# Patient Record
Sex: Male | Born: 1940 | Race: White | Hispanic: No | Marital: Single | Smoking: Current every day smoker
Health system: Southern US, Community
[De-identification: ages and names within clinical notes are randomized; demographics above are authoritative.]

## PROBLEM LIST (undated history)

## (undated) DIAGNOSIS — I10 Essential (primary) hypertension: Secondary | ICD-10-CM

## (undated) DIAGNOSIS — C801 Malignant (primary) neoplasm, unspecified: Secondary | ICD-10-CM

## (undated) DIAGNOSIS — I2510 Atherosclerotic heart disease of native coronary artery without angina pectoris: Secondary | ICD-10-CM

## (undated) DIAGNOSIS — I219 Acute myocardial infarction, unspecified: Secondary | ICD-10-CM

## (undated) DIAGNOSIS — Z87442 Personal history of urinary calculi: Secondary | ICD-10-CM

## (undated) HISTORY — PX: TONSILLECTOMY: SUR1361

## (undated) HISTORY — PX: CAROTID ENDARTERECTOMY: SUR193

## (undated) HISTORY — PX: APPENDECTOMY: SHX54

## (undated) HISTORY — PX: CORONARY ARTERY BYPASS GRAFT: SHX141

---

## 2006-11-15 ENCOUNTER — Other Ambulatory Visit: Payer: Self-pay

## 2006-11-15 ENCOUNTER — Inpatient Hospital Stay: Payer: Self-pay | Admitting: Internal Medicine

## 2006-11-16 ENCOUNTER — Other Ambulatory Visit: Payer: Self-pay

## 2009-04-07 ENCOUNTER — Ambulatory Visit: Payer: Self-pay | Admitting: Vascular Surgery

## 2010-03-21 ENCOUNTER — Observation Stay: Payer: Self-pay | Admitting: Internal Medicine

## 2010-03-30 ENCOUNTER — Ambulatory Visit: Payer: Self-pay | Admitting: Vascular Surgery

## 2010-05-11 ENCOUNTER — Ambulatory Visit: Payer: Self-pay | Admitting: Vascular Surgery

## 2010-05-25 ENCOUNTER — Inpatient Hospital Stay: Payer: Self-pay | Admitting: Vascular Surgery

## 2011-01-16 ENCOUNTER — Ambulatory Visit: Payer: Self-pay | Admitting: Internal Medicine

## 2011-03-04 ENCOUNTER — Emergency Department: Payer: Self-pay | Admitting: Unknown Physician Specialty

## 2011-08-08 ENCOUNTER — Ambulatory Visit: Payer: Self-pay | Admitting: Specialist

## 2011-11-30 ENCOUNTER — Ambulatory Visit: Payer: Self-pay

## 2011-12-04 ENCOUNTER — Ambulatory Visit: Payer: Self-pay | Admitting: Internal Medicine

## 2012-02-07 ENCOUNTER — Ambulatory Visit: Payer: Self-pay | Admitting: Internal Medicine

## 2012-02-21 ENCOUNTER — Ambulatory Visit: Payer: Self-pay | Admitting: Specialist

## 2012-02-21 LAB — CREATININE, SERUM: EGFR (African American): 50 — ABNORMAL LOW

## 2012-08-26 ENCOUNTER — Ambulatory Visit: Payer: Self-pay | Admitting: Specialist

## 2012-12-17 ENCOUNTER — Ambulatory Visit: Payer: Self-pay | Admitting: Family Medicine

## 2013-03-12 ENCOUNTER — Ambulatory Visit: Payer: Self-pay | Admitting: Specialist

## 2013-09-22 ENCOUNTER — Ambulatory Visit: Payer: Self-pay | Admitting: Specialist

## 2013-11-10 ENCOUNTER — Ambulatory Visit: Payer: Self-pay

## 2013-12-22 ENCOUNTER — Ambulatory Visit: Payer: Self-pay | Admitting: Physician Assistant

## 2014-04-12 IMAGING — CT CT CHEST W/O CM
1 series · 16 of 32 positions shown, 20 images · non-contrast
Comparison: none

REASON FOR EXAM: 6 Mo Follow Up Pulmonary Nodules
COMMENTS:

PROCEDURE:     LARS OLOV - LARS OLOV CHEST WITHOUT CONTRAST  - September 22, 2013  [DATE]
RESULT:     History: Pulmonary nodule.
Comparison Study: CT 03/12/2013.

[Series 2: soft tissue · axial · 0.67mm/px · z∈[-384,-81]mm · 16 of 111 slices shown, 20 images]
[im 5/111  mediastinal]
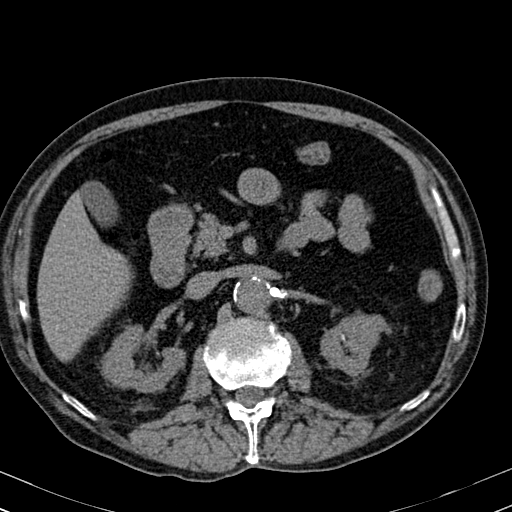
[im 5/111  lung]
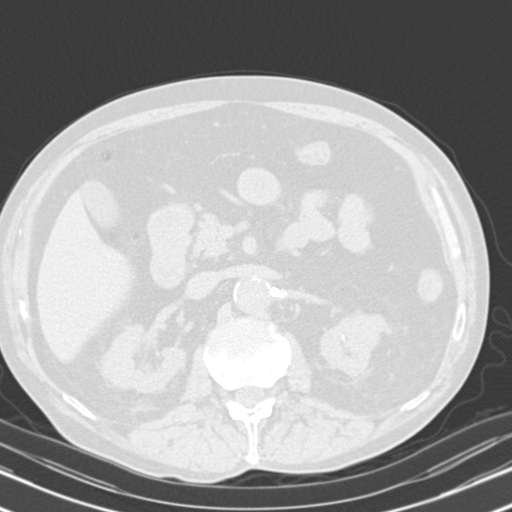
[im 13/111  lung]
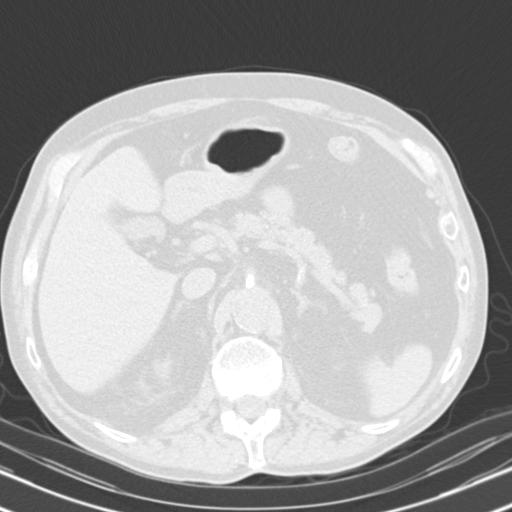
[im 21/111  lung]
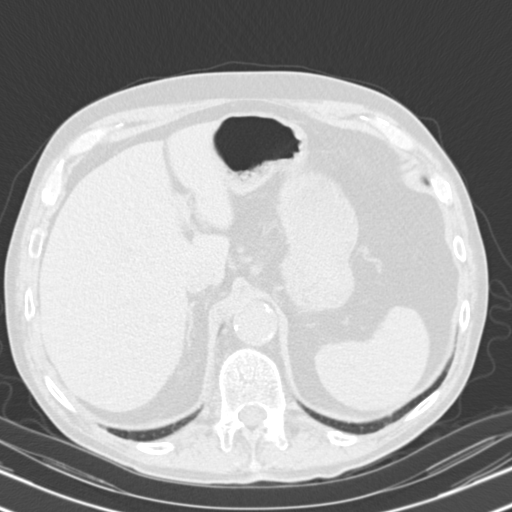
[im 25/111  lung]
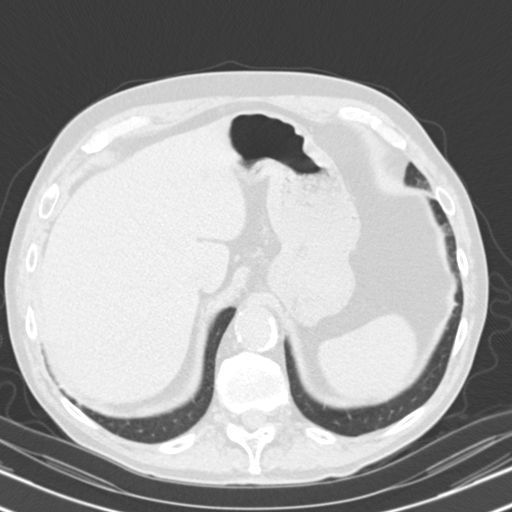
[im 33/111  mediastinal]
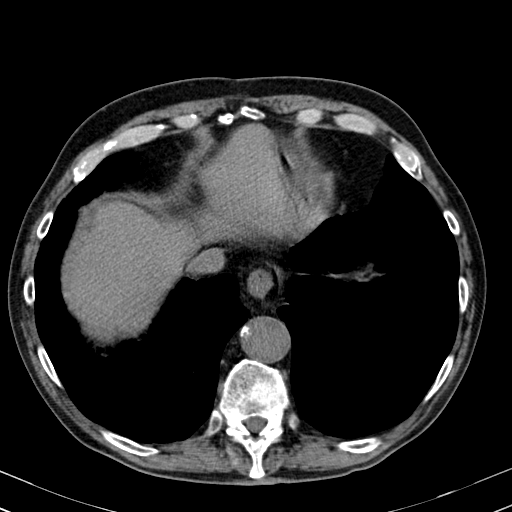
[im 33/111  lung]
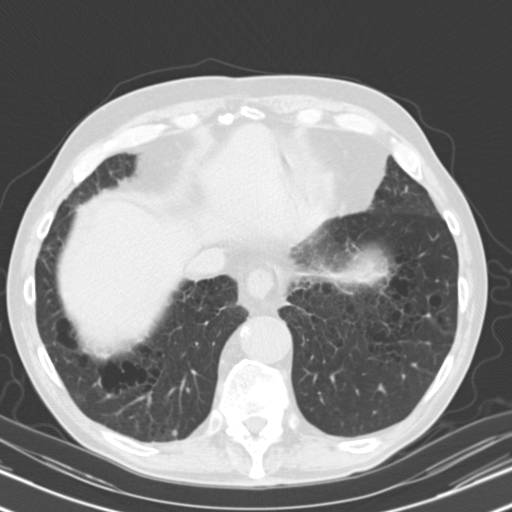
[im 41/111  lung]
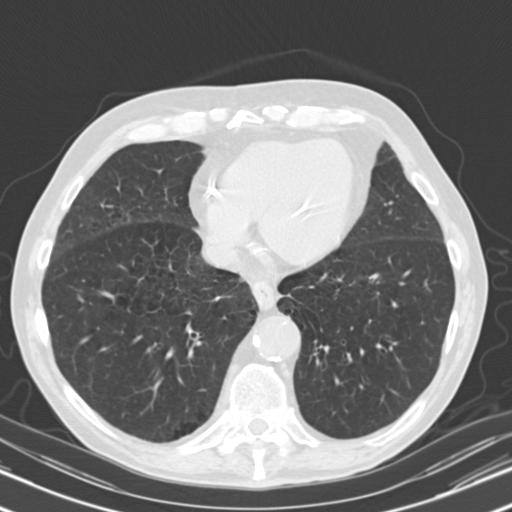
[im 49/111  lung]
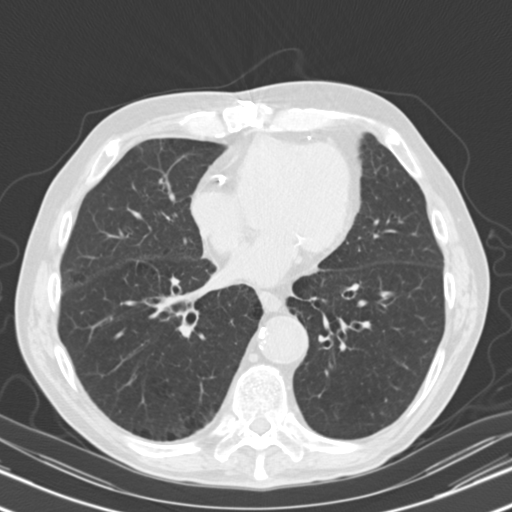
[im 58/111  lung]
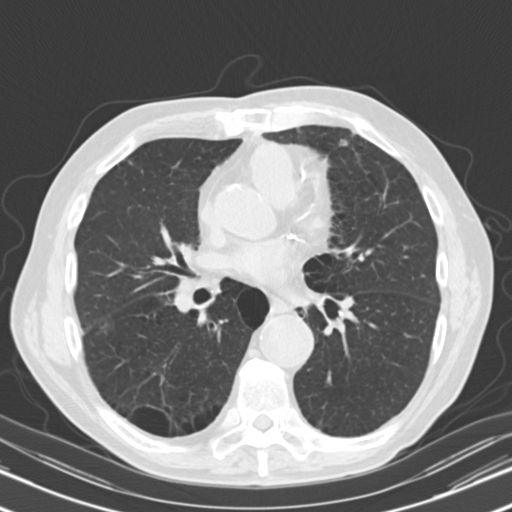
[im 59/111  mediastinal]
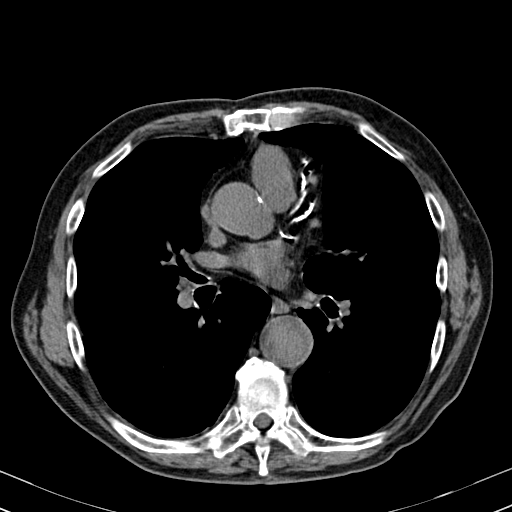
[im 59/111  lung]
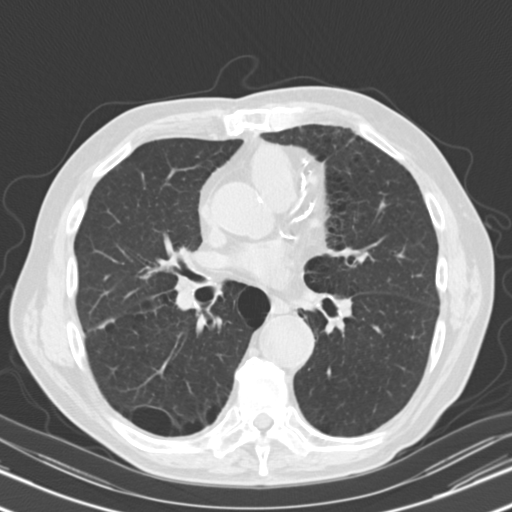
[im 66/111  lung]
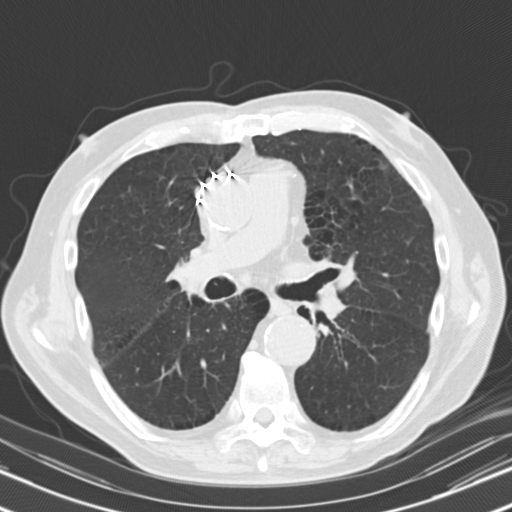
[im 70/111  lung]
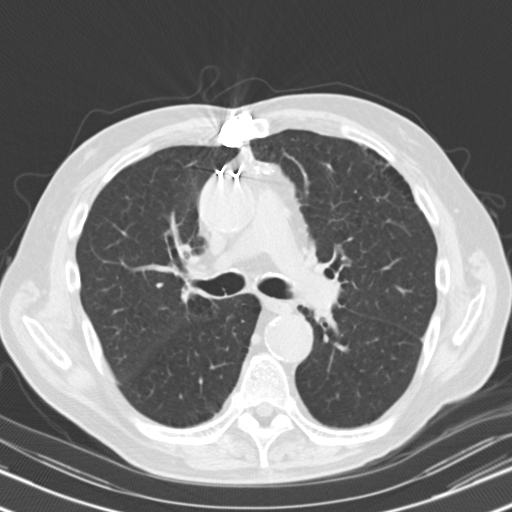
[im 78/111  lung]
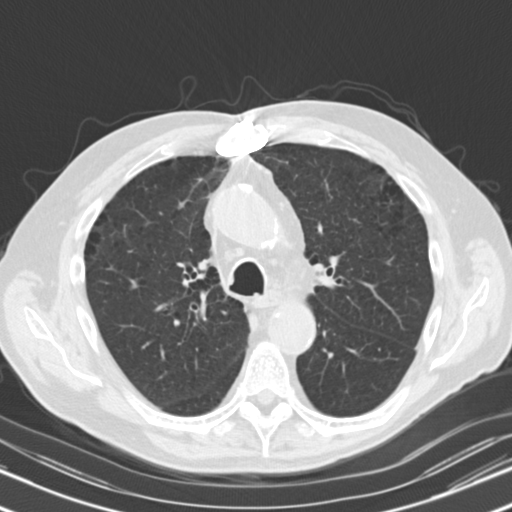
[im 86/111  mediastinal]
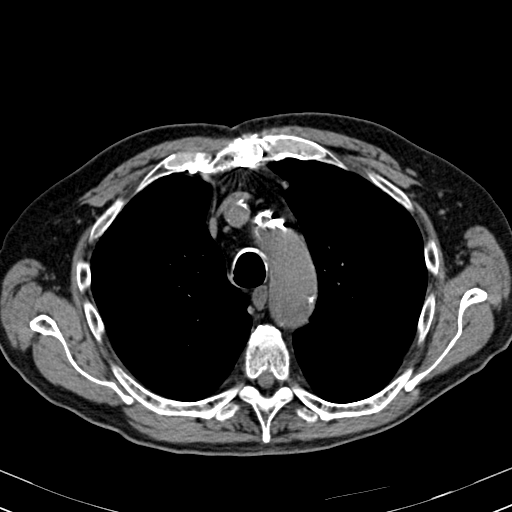
[im 86/111  lung]
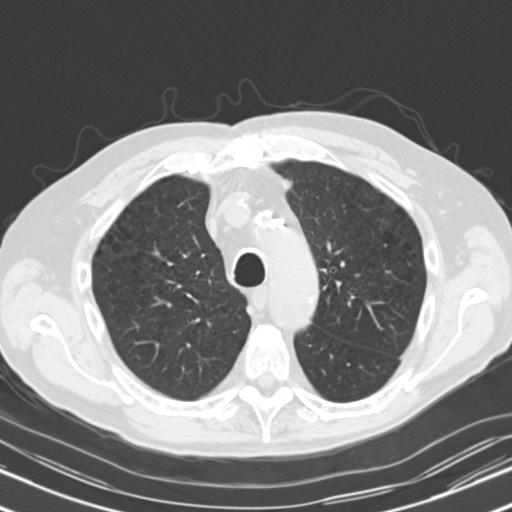
[im 90/111  lung]
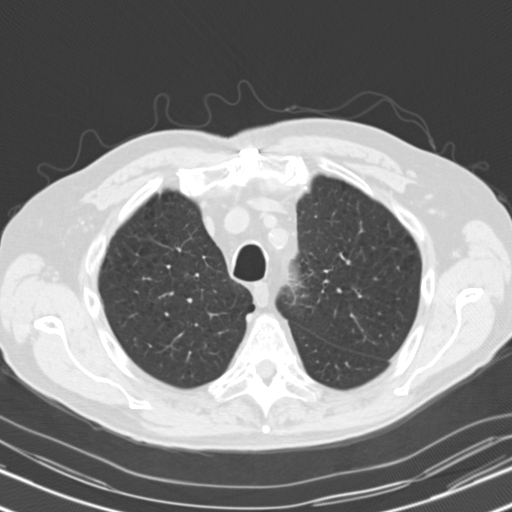
[im 98/111  lung]
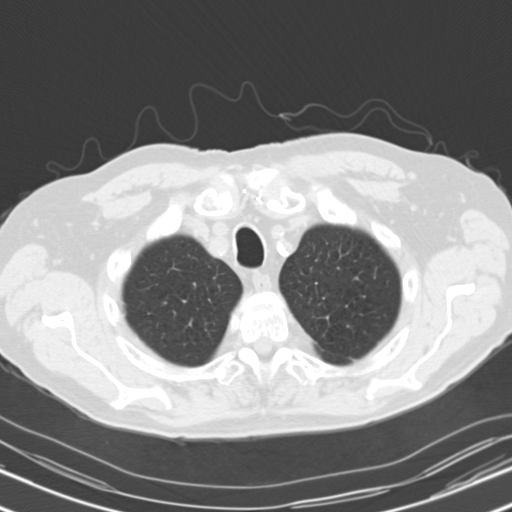
[im 106/111  lung]
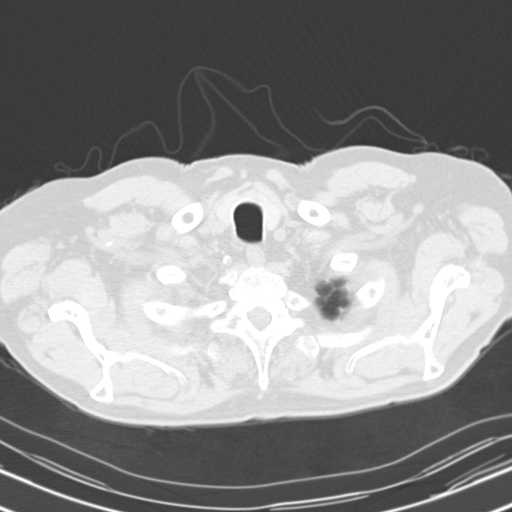

[16 of 32 positions shown; findings below may reference images not displayed]

FINDINGS: Standard nonenhanced CT obtained. Shotty mediastinal lymph nodes
are present. Coronary artery disease. Heart size is normal. Adrenals normal.
Gallstones are noted.

Large airways demonstrate multifocal areas of small a proximally 3 mm soft
tissue disease, most likely mucous. Again noted are multiple subcentimeter
pulmonary nodules with similar appearance from prior study. The largest is
present on lung windows image 85 and measures approximately 7 to 8 mm as on
prior exam. Prior CABG. Heart size normal. Sliding hiatal hernia. Adrenals
normal. No significant bony abnormalities identified.
IMPRESSION: Multiple subcentimeter pulmonary nodules again noted
without significant interim change.

## 2014-05-01 DIAGNOSIS — Z951 Presence of aortocoronary bypass graft: Secondary | ICD-10-CM | POA: Insufficient documentation

## 2014-05-01 DIAGNOSIS — Z95828 Presence of other vascular implants and grafts: Secondary | ICD-10-CM | POA: Insufficient documentation

## 2014-05-01 DIAGNOSIS — I255 Ischemic cardiomyopathy: Secondary | ICD-10-CM | POA: Insufficient documentation

## 2014-05-01 DIAGNOSIS — Z9889 Other specified postprocedural states: Secondary | ICD-10-CM | POA: Insufficient documentation

## 2014-05-01 DIAGNOSIS — E785 Hyperlipidemia, unspecified: Secondary | ICD-10-CM | POA: Insufficient documentation

## 2014-05-01 DIAGNOSIS — I739 Peripheral vascular disease, unspecified: Secondary | ICD-10-CM | POA: Insufficient documentation

## 2015-10-04 ENCOUNTER — Encounter: Payer: Self-pay | Admitting: Emergency Medicine

## 2015-10-04 ENCOUNTER — Ambulatory Visit (INDEPENDENT_AMBULATORY_CARE_PROVIDER_SITE_OTHER): Payer: Medicare Other

## 2015-10-04 ENCOUNTER — Ambulatory Visit
Admission: EM | Admit: 2015-10-04 | Discharge: 2015-10-04 | Disposition: A | Discharge status: Home / Self Care | Payer: Medicare Other | Attending: Family Medicine | Admitting: Family Medicine

## 2015-10-04 DIAGNOSIS — J0110 Acute frontal sinusitis, unspecified: Secondary | ICD-10-CM | POA: Diagnosis not present

## 2015-10-04 DIAGNOSIS — J40 Bronchitis, not specified as acute or chronic: Secondary | ICD-10-CM

## 2015-10-04 MED ORDER — AZITHROMYCIN 250 MG PO TABS
ORAL_TABLET | ORAL | Status: DC
Start: 2015-10-04 — End: 2017-06-22

## 2015-10-04 MED ORDER — BENZONATATE 100 MG PO CAPS: 100.0000 mg | ORAL_CAPSULE | Freq: Three times a day (TID) | ORAL | Status: DC | PRN

## 2015-10-04 NOTE — ED Notes (Signed)
Pt with sinus pressure and drainage

## 2015-10-04 NOTE — Discharge Instructions (Signed)
Take medication as prescribed. Rest. Eat and drink regularly.  Follow-up with your primary care physician this week as needed. Return to the urgent care as needed for new or worsening concerns.

## 2015-10-04 NOTE — ED Provider Notes (Signed)
[facility]  ____________________________________________  Time seen: Approximately 12:35 PM  I have reviewed the triage vital signs and the nursing notes.   HISTORY  Chief Complaint Facial Pain   HPI Scott Larsen is a 75 y.o. male presents for the complaints of to 2 weeks of runny nose, congestion, sinus pressure and cough. Patient reports initially he had more cough and states that the cough is now improved and only intermittent and occasional. States no cough today. Patient states however he continues with sinus pressure and drainage. States sinus drainage is thick and greenish colored. States his face is very tender around his forehead as well as his cheeks. States current sinus discomfort is 5 out of 10 aching. Denies headache. Reports continues to eat and drink well. Denies fevers.  Denies chest pain, shortness of breath, abdominal pain, dizziness, weakness or vision changes. Denies wheezing.  Reports has remained active. Reports history of sinus infection in past and this feels similar to that. Denies known sick contacts.   Past medical history: Hypertension Hyperlipidemia Emphysema Coronary artery disease Tobacco abuse  There are no active problems to display for this patient.   past surgical history 2007 CABG Carotid endarterectomy  PCP: [facility]och[facility]  Current Outpatient Rx  Name  Route  Sig  Dispense  Refill  . simvastatin (ZOCOR) 20 MG tablet   Oral   Take 20 mg by mouth daily.         .           .             Allergies Review of patient's allergies indicates no known allergies.  History reviewed. No pertinent family history.  Social History Social History  Substance Use Topics  . Smoking status: Current Every Day Smoker  . Smokeless tobacco: None  . Alcohol Use: Yes    Review of Systems Constitutional: No fever/chills Eyes: No visual changes. ENT: No sore throat. Positive runny nose, congestion sinus pressure. Cardiovascular:  Denies chest pain. Respiratory: Denies shortness of breath. Reports occasional cough. Gastrointestinal: No abdominal pain.  No nausea, no vomiting.  No diarrhea.  No constipation. Genitourinary: Negative for dysuria. Musculoskeletal: Negative for back pain. Skin: Negative for rash. Neurological: Negative for headaches, focal weakness or numbness.  10-point ROS otherwise negative.  ____________________________________________   PHYSICAL EXAM:  VITAL SIGNS: ED Triage Vitals  Enc Vitals Group     BP 10/04/15 1110 134/78 mmHg     Pulse Rate 10/04/15 1110 72     Resp 10/04/15 1110 20     Temp 10/04/15 1110 98.9 F (37.2 C)     Temp Source 10/04/15 1110 Tympanic     SpO2 10/04/15 1110 100 %     Weight 10/04/15 1110 154 lb (69.854 kg)     Height 10/04/15 1110 5\' 9"  (1.753 m)     Head Cir --      Peak Flow --      Pain Score 10/04/15 1112 6     Pain Loc --      Pain Edu? --      Excl. in [place]? --     Constitutional: Alert and oriented. Well appearing and in no acute distress. Ambulatory in room and hallway. Eyes: Conjunctivae are normal. PERRL. EOMI. Head: Atraumatic. Mild to moderate tenderness to palpation bilateral frontal sinuses. Mild tenderness to palpation bilateral maxillary sinuses. No swelling. No erythema.  Ears: no erythema, normal TMs bilaterally.   Nose: Greenish nasal discharge present. Bilateral nasal turbinate  erythema. Nares patent.  Mouth/Throat: Mucous membranes are moist.  Oropharynx non-erythematous. No tonsillar swelling or exudate. No uvular shift or deviation. Neck: No stridor.  No cervical spine tenderness to palpation. Hematological/Lymphatic/Immunilogical: No cervical lymphadenopathy. Cardiovascular: Normal rate, regular rhythm. Grossly normal heart sounds.  Good peripheral circulation. Respiratory: Normal respiratory effort.  No retractions. No wheezes or rales. Minimal base rhonchi. Good air movement. Speaks in complete sentences. Gastrointestinal:  Soft and nontender. No distention. Normal Bowel sounds.  No abdominal bruits. No CVA tenderness. Musculoskeletal: No lower or upper extremity tenderness nor edema. Bilateral pedal pulses equal and easily palpated.  Neurologic:  Normal speech and language. No gross focal neurologic deficits are appreciated. No gait instability. Skin:  Skin is warm, dry and intact. No rash noted. Psychiatric: Mood and affect are normal. Speech and behavior are normal.  ____________________________________________   LABS (all labs ordered are listed, but only abnormal results are displayed)  Labs Reviewed - No data to display  RADIOLOGY  EXAM: CHEST 2 VIEW  COMPARISON: CT chest of 09/22/2013, 02/21/2012, and chest x-ray of 12/04/2011  FINDINGS: The lungs remain hyperaerated consistent with emphysema. No definite pneumonia is seen. Minimally prominent markings are noted anteriorly on the lateral view either within the right middle lobe or lingula of questionable significance. If the patient's symptoms persist or worsen followup chest x-ray is recommend commended to exclude a focus of pneumonia. There is some peribronchial thickening most consistent with bronchitis. The noncalcified small nodules noted by prior CT's are not visible by chest x-ray. Mediastinal and hilar contours are unremarkable. The heart is within normal limits in size. Median sternotomy sutures are present from prior CABG. No acute bony abnormality is seen.  IMPRESSION: 1. Emphysema. No definite pneumonia. Minimally prominent markings on the lateral view anteriorly of questionable significance possibly due to overlap of bony and vascular structures. If symptoms persist or worsen consider follow-up chest x-ray. 2. The noncalcified nodules by prior CT are not seen by chest x-ray. 3. Peribronchial thickening consistent with bronchitis.   Electronically Signed By: Ivar Drape M.D. On: 10/04/2015 12:16  I, Marylene Land,  personally viewed and evaluated these images (plain radiographs) as part of my medical decision making.   ____________________________________________   INITIAL IMPRESSION / ASSESSMENT AND PLAN / ED COURSE  Pertinent labs & imaging results that were available during my care of the patient were reviewed by me and considered in my medical decision making (see chart for details).  Very well-appearing patient. No acute distress. Ambulatory in room and hallway without difficulty. Presents for 2 weeks of runny nose, cough, congestion and sinus pressure unrelieved by over-the-counter Mucinex.. Denies other complaints. Denies chest pain, shortness of breath or wheezing. Reports continues to eat and drink well. Denies fevers. Suspect sinusitis and bronchitis. Will also evaluate chest x-ray. Patient reports that he has a history of sinus infections and this feels similar. States that he has been treated in the past with azithromycin for the same and reports that worked well for him.  Radiologists chest x-ray readings and chest x-ray reviewed. Chest x-ray emphysema. No definite pneumonia. Peribronchial thickening consistent with bronchitis.  Discussed with patient will treat sinusitis and bronchitis with oral azithromycin. When necessary Tessalon Perles. Encouraged rest and fluids. Discussed follow up with Primary care physician this week. Discussed follow up and return parameters including return to the urgent care proceed to the emergency room for fever, chest pain, shortness of breath, weakness, dizziness, no resolution or any worsening concerns. Also counseled patient that if  symptoms continue then he need for repeat chest x-ray. Patient verbalized understanding and agreed to plan.   ____________________________________________   FINAL CLINICAL IMPRESSION(S) / ED DIAGNOSES  Final diagnoses:  Bronchitis  Acute frontal sinusitis, recurrence not specified       Marylene Land, NP 10/04/15 1448

## 2016-06-09 DIAGNOSIS — N183 Chronic kidney disease, stage 3 unspecified: Secondary | ICD-10-CM | POA: Insufficient documentation

## 2016-12-25 ENCOUNTER — Other Ambulatory Visit (INDEPENDENT_AMBULATORY_CARE_PROVIDER_SITE_OTHER): Payer: Self-pay | Admitting: Vascular Surgery

## 2016-12-25 DIAGNOSIS — I700 Atherosclerosis of aorta: Secondary | ICD-10-CM

## 2016-12-25 DIAGNOSIS — I708 Atherosclerosis of other arteries: Principal | ICD-10-CM

## 2016-12-25 DIAGNOSIS — I6523 Occlusion and stenosis of bilateral carotid arteries: Secondary | ICD-10-CM

## 2016-12-26 ENCOUNTER — Encounter (INDEPENDENT_AMBULATORY_CARE_PROVIDER_SITE_OTHER): Payer: Medicare Other

## 2016-12-26 ENCOUNTER — Ambulatory Visit (INDEPENDENT_AMBULATORY_CARE_PROVIDER_SITE_OTHER): Payer: Self-pay | Admitting: Vascular Surgery

## 2017-06-20 ENCOUNTER — Other Ambulatory Visit (INDEPENDENT_AMBULATORY_CARE_PROVIDER_SITE_OTHER): Payer: Self-pay | Admitting: Vascular Surgery

## 2017-06-20 DIAGNOSIS — I739 Peripheral vascular disease, unspecified: Secondary | ICD-10-CM

## 2017-06-22 ENCOUNTER — Ambulatory Visit (INDEPENDENT_AMBULATORY_CARE_PROVIDER_SITE_OTHER): Payer: Medicare Other

## 2017-06-22 ENCOUNTER — Ambulatory Visit (INDEPENDENT_AMBULATORY_CARE_PROVIDER_SITE_OTHER): Payer: Medicare Other | Admitting: Vascular Surgery

## 2017-06-22 ENCOUNTER — Encounter (INDEPENDENT_AMBULATORY_CARE_PROVIDER_SITE_OTHER): Payer: Self-pay | Admitting: Vascular Surgery

## 2017-06-22 VITALS — BP 90/63 | HR 62 | Resp 16 | Ht 70.0 in | Wt 152.0 lb

## 2017-06-22 DIAGNOSIS — F17200 Nicotine dependence, unspecified, uncomplicated: Secondary | ICD-10-CM

## 2017-06-22 DIAGNOSIS — I708 Atherosclerosis of other arteries: Secondary | ICD-10-CM | POA: Diagnosis not present

## 2017-06-22 DIAGNOSIS — I700 Atherosclerosis of aorta: Secondary | ICD-10-CM | POA: Diagnosis not present

## 2017-06-22 DIAGNOSIS — I739 Peripheral vascular disease, unspecified: Secondary | ICD-10-CM

## 2017-06-22 DIAGNOSIS — F17210 Nicotine dependence, cigarettes, uncomplicated: Secondary | ICD-10-CM

## 2017-06-22 DIAGNOSIS — I6523 Occlusion and stenosis of bilateral carotid arteries: Secondary | ICD-10-CM | POA: Diagnosis not present

## 2017-06-22 DIAGNOSIS — I714 Abdominal aortic aneurysm, without rupture, unspecified: Secondary | ICD-10-CM

## 2017-06-22 DIAGNOSIS — I6529 Occlusion and stenosis of unspecified carotid artery: Secondary | ICD-10-CM | POA: Insufficient documentation

## 2017-06-22 NOTE — Assessment & Plan Note (Signed)
His noninvasive studies today show a right ABI of 1.0 and a left ABI of 0.88 with good wave forms. His iliac arteries have only mildly elevated velocities. His aneurysm now measures 3.7 cm in maximal diameter which is a slight progression from his previous study. No limb threatening symptoms. Continue aspirin and statin agent. Recheck in 1 year.

## 2017-06-22 NOTE — Assessment & Plan Note (Signed)
He also has a history of carotid disease. He is about 7 years status post right carotid endarterectomy. His duplex today shows markedly elevated velocities which were a dramatic increase from his velocities a year ago in the right carotid artery now measuring in the 80-99% range. His velocities are also increased on the left now on the upper end of the 60-79% range This is a significant worsening from the findings from a year ago. At this point, we need to get a CT angiogram for further evaluation. Pending the results of that CT angiogram, intervention may be required. I will see him back following the CTA.

## 2017-06-22 NOTE — Assessment & Plan Note (Signed)

## 2017-06-22 NOTE — Assessment & Plan Note (Signed)
Slight increase in size now measuring 3.7 cm in maximal diameter. Smoking cessation and blood pressure control stressed. Recheck in 1 year.

## 2017-06-22 NOTE — Progress Notes (Signed)
MRN : [id number]  Scott Larsen is a 76 y.o. (1941/02/27) male who presents with chief complaint of  Chief Complaint  Patient presents with  . Re-evaluation    1 year ABI  .  History of Present Illness: Patient returns today in follow up of multiple vascular issues. He denies any current lifestyle limiting claudication, ischemic rest pain, or ulceration of the lower extremities. He has undergone aortoiliac duplex several years ago. He also has a known aneurysm we have been following. He denies any aneurysm related symptoms. Specifically, the patient denies new back or abdominal pain, or signs of peripheral embolization.  His noninvasive studies today show a right ABI of 1.0 and a left ABI of 0.88 with good wave forms. His iliac arteries have only mildly elevated velocities. His aneurysm now measures 3.7 cm in maximal diameter which is a slight progression from his previous study. He also has a history of carotid disease. He is about 7 years status post right carotid endarterectomy. I. His duplex today shows markedly elevated velocities which were a dramatic increase from his velocities a year ago in the right carotid artery now measuring in the 80-99% range. His velocities are also increased on the left now on the upper end of the 60-79% range. He denies any recent focal neurologic symptoms. Specifically, the patient denies amaurosis fugax, speech or swallowing difficulties, or arm or leg weakness or numbness   Current Outpatient Prescriptions  Medication Sig Dispense Refill  . aspirin EC 325 MG tablet Take by mouth.    . benzonatate (TESSALON PERLES) 100 MG capsule Take 1 capsule (100 mg total) by mouth 3 (three) times daily as needed for cough. 15 capsule 0  . fluorouracil (EFUDEX) 5 % cream     . metoprolol tartrate (LOPRESSOR) 25 MG tablet     . simvastatin (ZOCOR) 20 MG tablet Take 20 mg by mouth daily.     No current facility-administered medications for this visit.      PMH: CAS PAD AAA HTN Hyperlipidemia   PSH: Right CEA Iliac stent placement   Social History Social History  Substance Use Topics  . Smoking status: Current Every Day Smoker  . Smokeless tobacco: Never Used  . Alcohol use Yes  No IV drug use  Family History No bleeding disorders, clotting disorders, autoimmune diseases, or aneurysms  No Known Allergies   REVIEW OF SYSTEMS (Negative unless checked)  Constitutional: [] Weight loss  [] Fever  [] Chills Cardiac: [] Chest pain   [] Chest pressure   [] Palpitations   [] Shortness of breath when laying flat   [] Shortness of breath at rest   [] Shortness of breath with exertion. Vascular:  [] Pain in legs with walking   [] Pain in legs at rest   [] Pain in legs when laying flat   [x] Claudication   [] Pain in feet when walking  [] Pain in feet at rest  [] Pain in feet when laying flat   [] History of DVT   [] Phlebitis   [] Swelling in legs   [] Varicose veins   [] Non-healing ulcers Pulmonary:   [] Uses home oxygen   [] Productive cough   [] Hemoptysis   [] Wheeze  [] COPD   [] Asthma Neurologic:  [] Dizziness  [] Blackouts   [] Seizures   [] History of stroke   [] History of TIA  [] Aphasia   [] Temporary blindness   [] Dysphagia   [] Weakness or numbness in arms   [] Weakness or numbness in legs Musculoskeletal:  [x] Arthritis   [] Joint swelling   [] Joint pain   [] Low back pain Hematologic:  []   Easy bruising  [] Easy bleeding   [] Hypercoagulable state   [] Anemic   Gastrointestinal:  [] Blood in stool   [] Vomiting blood  [] Gastroesophageal reflux/heartburn   [] Abdominal pain Genitourinary:  [] Chronic kidney disease   [] Difficult urination  [] Frequent urination  [] Burning with urination   [] Hematuria Skin:  [] Rashes   [] Ulcers   [] Wounds Psychological:  [] History of anxiety   []  History of major depression.  Physical Examination  BP 90/63 (BP Location: Left Arm)   Pulse 62   Resp 16   Ht 5\' 10"  (1.778 m)   Wt 68.9 kg (152 lb)   BMI 21.81 kg/m  Gen:  WD/WN,  NAD Head: [place]/AT, No temporalis wasting. Ear/Nose/Throat: Hearing grossly intact, nares w/o erythema or drainage, trachea midline Eyes: Conjunctiva clear. Sclera non-icteric Neck: Supple.  No JVD.  Pulmonary:  Good air movement, no use of accessory muscles.  Cardiac: RRR, normal S1, S2 Vascular:  Vessel Right Left  Radial Palpable Palpable  Ulnar Palpable Palpable  Brachial Palpable Palpable  Carotid Palpable, with bruit Palpable, with bruit  Aorta Palpable N/A  Femoral Palpable Palpable  Popliteal Palpable Palpable  PT 1+ Palpable 1+ Palpable  DP Palpable 1+ Palpable   Gastrointestinal: soft, non-tender/non-distended. Slight increased aortic impulse Musculoskeletal: M/S 5/5 throughout.  No deformity or atrophy. no edema. Neurologic: Sensation grossly intact in extremities.  Symmetrical.  Speech is fluent.  Psychiatric: Judgment intact, Mood & affect appropriate for pt's clinical situation. Dermatologic: No rashes or ulcers noted.  No cellulitis or open wounds.       Labs No results found for this or any previous visit (from the past 2160 hour(s)).  Radiology No results found.    Assessment/Plan  Tobacco use disorder We had a discussion for approximately 3 minutes regarding the absolute need for smoking cessation due to the deleterious nature of tobacco on the vascular system. We discussed the tobacco use would diminish patency of any intervention, and likely significantly worsen progressio of disease. We discussed multiple agents for quitting including replacement therapy or medications to reduce cravings such as Chantix. The patient voices their understanding of the importance of smoking cessation.  Carotid stenosis He also has a history of carotid disease. He is about 7 years status post right carotid endarterectomy. His duplex today shows markedly elevated velocities which were a dramatic increase from his velocities a year ago in the right carotid artery now measuring  in the 80-99% range. His velocities are also increased on the left now on the upper end of the 60-79% range This is a significant worsening from the findings from a year ago. At this point, we need to get a CT angiogram for further evaluation. Pending the results of that CT angiogram, intervention may be required. I will see him back following the CTA.  AAA (abdominal aortic aneurysm) without rupture (HCC) Slight increase in size now measuring 3.7 cm in maximal diameter. Smoking cessation and blood pressure control stressed. Recheck in 1 year.  PAD (peripheral artery disease) ([place]) His noninvasive studies today show a right ABI of 1.0 and a left ABI of 0.88 with good wave forms. His iliac arteries have only mildly elevated velocities. His aneurysm now measures 3.7 cm in maximal diameter which is a slight progression from his previous study. No limb threatening symptoms. Continue aspirin and statin agent. Recheck in 1 year.    Leotis Pain, MD  06/22/2017 12:41 PM    This note was created with Dragon medical transcription system.  Any errors from dictation  are purely unintentional

## 2017-06-22 NOTE — Patient Instructions (Signed)
Carotid Artery Disease The carotid arteries are arteries on both sides of the neck. They carry blood to the brain. Carotid artery disease is when the arteries get smaller (narrow) or get blocked. If these arteries get smaller or get blocked, you are more likely to have a stroke or warning stroke (transient ischemic attack). Follow these instructions at home:  Take medicines as told by your doctor. Make sure you understand all your medicine instructions. Do not stop your medicines without talking to your doctor first.  Follow your doctor's diet instructions. It is important to eat a healthy diet that includes plenty of: ? Fresh fruits. ? Vegetables. ? Lean meats.  Avoid: ? High-fat foods. ? High-sodium foods. ? Foods that are fried, overly processed, or have poor nutritional value.  Stay a healthy weight.  Stay active. Get at least 30 minutes of activity every day.  Do not smoke.  Limit alcohol use to: ? No more than 2 drinks a day for men. ? No more than 1 drink a day for women who are not pregnant.  Do not use illegal drugs.  Keep all doctor visits as told. Get help right away if:  You have sudden weakness or loss of feeling (numbness) on one side of the body, such as the face, arm, or leg.  You have sudden confusion.  You have trouble speaking (aphasia) or understanding.  You have sudden trouble seeing out of one or both eyes.  You have sudden trouble walking.  You have dizziness or feel like you might pass out (faint).  You have a loss of balance or your movements are not steady (uncoordinated).  You have a sudden, severe headache with no known cause.  You have trouble swallowing (dysphagia). Call your local emergency services (911 in [place] Do notdrive yourself to the clinic or hospital. This information is not intended to replace advice given to you by your health care provider. Make sure you discuss any questions you have with your health care  provider. Document Released: 10/30/2012 Document Revised: 04/20/2016 Document Reviewed: 05/14/2013 Elsevier Interactive Patient Education  2018 [employer]  

## 2017-06-29 ENCOUNTER — Ambulatory Visit: Payer: Medicare Other

## 2017-07-03 ENCOUNTER — Ambulatory Visit (INDEPENDENT_AMBULATORY_CARE_PROVIDER_SITE_OTHER): Payer: Medicare Other | Admitting: Vascular Surgery

## 2017-07-05 ENCOUNTER — Ambulatory Visit
Admission: RE | Admit: 2017-07-05 | Discharge: 2017-07-05 | Disposition: A | Discharge status: Home / Self Care | Payer: Medicare Other | Source: Ambulatory Visit | Attending: Vascular Surgery | Admitting: Vascular Surgery

## 2017-07-05 DIAGNOSIS — I771 Stricture of artery: Secondary | ICD-10-CM | POA: Insufficient documentation

## 2017-07-05 DIAGNOSIS — I700 Atherosclerosis of aorta: Secondary | ICD-10-CM | POA: Insufficient documentation

## 2017-07-05 DIAGNOSIS — I6523 Occlusion and stenosis of bilateral carotid arteries: Secondary | ICD-10-CM

## 2017-07-05 HISTORY — DX: Essential (primary) hypertension: I10

## 2017-07-05 HISTORY — DX: Malignant (primary) neoplasm, unspecified: C80.1

## 2017-07-05 LAB — POCT I-STAT CREATININE: CREATININE: 1.5 mg/dL — AB (ref 0.61–1.24)

## 2017-07-05 MED ORDER — IOPAMIDOL (ISOVUE-370) INJECTION 76%
80.0000 mL | Freq: Once | INTRAVENOUS | Status: AC | PRN
  Administered 2017-07-05: 60 mL via INTRAVENOUS

## 2017-07-06 NOTE — Progress Notes (Signed)
This was what we expected.  Will need right carotid stent but should be scheduled to come back and see me soon

## 2017-07-10 ENCOUNTER — Ambulatory Visit (INDEPENDENT_AMBULATORY_CARE_PROVIDER_SITE_OTHER): Payer: Medicare Other | Admitting: Vascular Surgery

## 2017-07-10 ENCOUNTER — Encounter (INDEPENDENT_AMBULATORY_CARE_PROVIDER_SITE_OTHER): Payer: Self-pay | Admitting: Vascular Surgery

## 2017-07-10 VITALS — BP 127/68 | HR 59 | Ht 70.0 in | Wt 154.0 lb

## 2017-07-10 DIAGNOSIS — F17200 Nicotine dependence, unspecified, uncomplicated: Secondary | ICD-10-CM | POA: Diagnosis not present

## 2017-07-10 DIAGNOSIS — I739 Peripheral vascular disease, unspecified: Secondary | ICD-10-CM | POA: Diagnosis not present

## 2017-07-10 DIAGNOSIS — I714 Abdominal aortic aneurysm, without rupture, unspecified: Secondary | ICD-10-CM

## 2017-07-10 DIAGNOSIS — I6523 Occlusion and stenosis of bilateral carotid arteries: Secondary | ICD-10-CM

## 2017-07-10 NOTE — Progress Notes (Signed)
MRN : [id number]  Scott Larsen is a 76 y.o. ([birth year]) male who presents with chief complaint of  Chief Complaint  Patient presents with  . Results    CT  .  History of Present Illness: Patient returns today in follow up of carotid disease. He has undergone a CT angiogram which I have independently reviewed. He has about a 90% recurrent stenosis of the right carotid artery and about a 70% stenosis in the left carotid artery. He is inferior status post carotid endarterectomy on the right. He continues to smoke.         Current Outpatient Prescriptions  Medication Sig Dispense Refill  . aspirin EC 325 MG tablet Take by mouth.    . benzonatate (TESSALON PERLES) 100 MG capsule Take 1 capsule (100 mg total) by mouth 3 (three) times daily as needed for cough. 15 capsule 0  . fluorouracil (EFUDEX) 5 % cream     . metoprolol tartrate (LOPRESSOR) 25 MG tablet     . simvastatin (ZOCOR) 20 MG tablet Take 20 mg by mouth daily.     No current facility-administered medications for this visit.     PMH: CAS PAD AAA HTN Hyperlipidemia   PSH: Right CEA Iliac stent placement   Social History     Social History  Substance Use Topics  . Smoking status: Current Every Day Smoker  . Smokeless tobacco: Never Used  . Alcohol use Yes  No IV drug use  Family History No bleeding disorders, clotting disorders, autoimmune diseases, or aneurysms  No Known Allergies   REVIEW OF SYSTEMS (Negative unless checked)  Constitutional: [] Weight loss  [] Fever  [] Chills Cardiac: [] Chest pain   [] Chest pressure   [] Palpitations   [] Shortness of breath when laying flat   [] Shortness of breath at rest   [] Shortness of breath with exertion. Vascular:  [] Pain in legs with walking   [] Pain in legs at rest   [] Pain in legs when laying flat   [x] Claudication   [] Pain in feet when walking  [] Pain in feet at rest  [] Pain in feet when laying flat   [] History of DVT   [] Phlebitis    [] Swelling in legs   [] Varicose veins   [] Non-healing ulcers Pulmonary:   [] Uses home oxygen   [] Productive cough   [] Hemoptysis   [] Wheeze  [] COPD   [] Asthma Neurologic:  [] Dizziness  [] Blackouts   [] Seizures   [] History of stroke   [] History of TIA  [] Aphasia   [] Temporary blindness   [] Dysphagia   [] Weakness or numbness in arms   [] Weakness or numbness in legs Musculoskeletal:  [x] Arthritis   [] Joint swelling   [] Joint pain   [] Low back pain Hematologic:  [] Easy bruising  [] Easy bleeding   [] Hypercoagulable state   [] Anemic   Gastrointestinal:  [] Blood in stool   [] Vomiting blood  [] Gastroesophageal reflux/heartburn   [] Abdominal pain Genitourinary:  [] Chronic kidney disease   [] Difficult urination  [] Frequent urination  [] Burning with urination   [] Hematuria Skin:  [] Rashes   [] Ulcers   [] Wounds Psychological:  [] History of anxiety   []  History of major depression.    Physical Examination  BP 127/68   Pulse (!) 59   Ht 5\' 10"  (1.778 m)   Wt 154 lb (69.9 kg)   BMI 22.10 kg/m  Gen:  WD/WN, NAD Head: [place]/AT, No temporalis wasting. Ear/Nose/Throat: Hearing grossly intact, nares w/o erythema or drainage, trachea midline Eyes: Conjunctiva clear. Sclera non-icteric Neck: Supple.  No JVD.  Pulmonary:  Good air movement, no use of accessory muscles.  Cardiac: RRR, normal S1, S2 Vascular:  Vessel Right Left  Radial Palpable Palpable  Ulnar Palpable Palpable  Brachial Palpable Palpable  Carotid Palpable, with bruit Palpable, with bruit                        Musculoskeletal: M/S 5/5 throughout.  No deformity or atrophy.  Neurologic: Sensation grossly intact in extremities.  Symmetrical.  Speech is fluent.  Psychiatric: Judgment intact, Mood & affect appropriate for pt's clinical situation. Dermatologic: No rashes or ulcers noted.  No cellulitis or open wounds.       Labs Recent Results (from the past 2160 hour(s))  I-STAT creatinine     Status: Abnormal   Collection  Time: 07/05/17  2:16 PM  Result Value Ref Range   Creatinine, Ser 1.50 (H) 0.61 - 1.24 mg/dL    Radiology Ct Angio Neck W/cm &/or Wo/cm  Result Date: 07/05/2017 CLINICAL DATA:  Abnormal carotid Dopplers. Right carotid endarterectomy. Bilateral carotid stenosis EXAM: CT ANGIOGRAPHY NECK TECHNIQUE: Multidetector CT imaging of the neck was performed using the standard protocol during bolus administration of intravenous contrast. Multiplanar CT image reconstructions and MIPs were obtained to evaluate the vascular anatomy. Carotid stenosis measurements (when applicable) are obtained utilizing NASCET criteria, using the distal internal carotid diameter as the denominator. CONTRAST:  80 mL Isovue 370 IV COMPARISON:  CT angio neck 03/30/2010 FINDINGS: Aortic arch: Severe diffuse atherosclerotic disease throughout the aortic arch with thick irregular plaque much of which is noncalcified. There also is extensive calcification. Negative for aneurysm. 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 Previous study demonstrated high-grade stenosis of the left proximal subclavian artery. Right carotid system: Diffuse atherosclerotic disease throughout the common carotid artery. Postop right carotid endarterectomy. Focal soft tissue density in the posterior bulb compressing the lumen. No significant calcification in this area. This is causing critical luminal stenosis greater than 90% diameter stenosis. This could represent plaque hemorrhage or focal atherosclerotic disease. Remainder of the internal carotid artery patent. External carotid artery patent Left carotid system: Extensive atherosclerotic disease throughout the left common carotid artery. Heavily calcified extensive plaque at the carotid bifurcation extending into the internal and external carotid artery. Estimated 70% diameter stenosis left  internal carotid artery, with mild progression since the prior CT. Moderate stenosis origin external carotid artery. Vertebral arteries: Both vertebral arteries are patent with scattered areas of atherosclerotic calcification. 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 Skeleton: Negative.  Poor dentition Other neck: Negative for mass or adenopathy. Mucosal edema in the paranasal sinuses. Diffuse bony thickening right maxillary sinus due to chronic sinusitis. Upper chest: Negative IMPRESSION: Advanced atherosclerotic disease diffusely including heavy atherosclerotic disease in the aortic arch Postop carotid endarterectomy on the right with severe recurrent stenosis of the proximal right internal carotid artery. Focal soft tissue in the carotid bulb compresses the lumen causing critical stenosis greater than 90%. 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 These results will be called to the ordering clinician or representative by the Radiologist Assistant, and communication documented in the PACS or zVision Dashboard. Electronically Signed   By: Franchot Gallo M.D.   On: 07/05/2017 14:56  Assessment/Plan AAA (abdominal aortic aneurysm) without rupture (HCC) Slight increase in size now measuring 3.7 cm in maximal diameter. Smoking cessation and blood pressure control stressed. Recheck in 1 year.  PAD (peripheral artery disease) ([place]) His noninvasive studies recently show a right ABI of 1.0 and a left ABI of 0.88 with good wave forms. His iliac arteries have only mildly elevated velocities. His aneurysm now measures 3.7 cm in maximal diameter which is a slight progression from his previous study. No limb threatening symptoms. Continue aspirin and statin  agent. Recheck in 1 year.  Carotid stenosis He has undergone a CT angiogram which I have independently reviewed. He has about a 90% recurrent stenosis of the right carotid artery and about a 70% stenosis in the left carotid artery. Given this finding, I started him on Plavix 75 mg daily. I have discussed that he should have a right carotid stent placement. I discussed why this is preferable to a recurrent or redo carotid endarterectomy. I have discussed the risks and benefits of carotid stenting in detail and the patient is agreeable to proceed. This will be scheduled in 2-3 weeks at his convenience at his request.    Leotis Pain, MD  07/10/2017 10:26 AM    This note was created with Dragon medical transcription system.  Any errors from dictation are purely unintentional

## 2017-07-10 NOTE — Patient Instructions (Signed)
Carotid Angioplasty With Stent Carotid angioplasty is a procedure to open or widen an artery in the neck (carotid artery) that is blocked or has become narrow. This is done by using a small piece of metal that looks like a coil or spring (stent). The stent helps keep the artery open by supporting the artery walls. The carotid arteries supply blood to the brain. When fats, cholesterol, and other materials (plaque) build up in an artery, the artery becomes narrow and can become blocked. This can reduce or block blood flow to certain areas of the brain, which can cause serious health problems, including stroke. Tell a health care provider about:  Any allergies you have.  All medicines you are taking, including vitamins, herbs, eye drops, creams, and over-the-counter medicines.  Any problems you or family members have had with anesthetic medicines.  Any blood disorders you have.  Any surgeries you have had.  Any medical conditions you have.  Whether you are pregnant or may be pregnant. What are the risks? Generally, this is a safe procedure. However, problems may occur, including:  Infection.  Bleeding.  Allergic reactions to medicines or dyes.  Damage to other structures or organs, or the carotid artery itself.  The carotid artery becoming blocked again.  A collection of blood under the skin (hematoma) around the stent site that gets larger (expands).  Blood clot in another part of the body.  Kidney injury.  What happens before the procedure?  Ask your health care provider about: ? Changing or stopping your regular medicines. This is especially important if you are taking diabetes medicines or blood thinners. ? Taking medicines such as aspirin and ibuprofen. These medicines can thin your blood. Do not take these medicines before your procedure if your health care provider instructs you not to.  Follow instructions from your health care provider about eating or drinking  restrictions.  Do not use any tobacco products for at least 24 hours before your procedure. This includes cigarettes, chewing tobacco, or e-cigarettes.  Ask your health care provider how your surgical site will be marked or identified.  You may be given antibiotic medicine to help prevent infection.  You may have blood tests done.  Plan to have someone take you home after the procedure.  If you will be going home right after the procedure, plan to have someone with you for 24 hours. What happens during the procedure?  To reduce your risk of infection: ? Your health care team will wash or sanitize their hands. ? Your skin will be washed with soap.  An IV tube will be inserted into one of your veins.  You will be given one or more of the following: ? A medicine to help you relax (sedative). ? A medicine to make you fall asleep (general anesthetic).  An cut (incision) will be made. Most commonly, an incision will be made in your groin. In some cases, an incision may be made in your wrist or forearm instead of your groin.  A small, flexible tube (catheter) will be inserted through your incision, into an artery. The catheter will be threaded upward into your carotid artery. An X-ray machine (fluoroscope) will help your health care provider guide the catheter to the correct place in your artery.  Dye will be injected into the catheter and will travel to the narrow or blocked part of your carotid artery.  X-ray images will be taken of how the dye flows through your artery. While images are being taken,   you may be given instructions about breathing, swallowing, moving, or talking.  A filter (distal protection device) will be inserted into your artery. This will be used to catch plaque that comes loose in your artery during the procedure. This prevents plaque from moving into your brain.  A small balloon will be inserted into your artery. The balloon will be inflated for a few seconds to  widen your artery and then removed.  The stent will be placed in your artery.  A second small balloon will be inserted into your artery and inflated. This expands the stent inside of your artery, so that the stent holds up the artery walls. The balloon will then be removed.  The catheter and the distal protection device will be removed from your artery.  Your incision may be closed with stitches (sutures), skin glue, or adhesive tape.  A bandage (dressing) will be placed over your incision. The procedure may vary among health care providers and hospitals. What happens after the procedure?  Your blood pressure, heart rate, breathing rate, and blood oxygen level will be monitored often until the medicines you were given have worn off.  You may continue to receive fluids and medicines through an IV tube.  You may have some pain. Pain medicines will be available to help you.  You may have X-rays to make sure that the stent is in the correct place.  You may have to wear compression stockings. These stockings help to prevent blood clots and reduce swelling in your legs.  Do not drive for 24 hours if you received a sedative. This information is not intended to replace advice given to you by your health care provider. Make sure you discuss any questions you have with your health care provider. Document Released: 03/27/2005 Document Revised: 04/20/2016 Document Reviewed: 08/08/2015 Elsevier Interactive Patient Education  2018 [employer] Inc[employer]  

## 2017-07-10 NOTE — Assessment & Plan Note (Signed)
He has undergone a CT angiogram which I have independently reviewed. He has about a 90% recurrent stenosis of the right carotid artery and about a 70% stenosis in the left carotid artery. Given this finding, I started him on Plavix 75 mg daily. I have discussed that he should have a right carotid stent placement. I discussed why this is preferable to a recurrent or redo carotid endarterectomy. I have discussed the risks and benefits of carotid stenting in detail and the patient is agreeable to proceed. This will be scheduled in 2-3 weeks at his convenience at his request.

## 2017-07-19 ENCOUNTER — Encounter (INDEPENDENT_AMBULATORY_CARE_PROVIDER_SITE_OTHER): Payer: Self-pay

## 2017-08-02 ENCOUNTER — Other Ambulatory Visit (INDEPENDENT_AMBULATORY_CARE_PROVIDER_SITE_OTHER): Payer: Self-pay

## 2017-08-07 ENCOUNTER — Encounter
Admission: RE | Admit: 2017-08-07 | Discharge: 2017-08-07 | Disposition: A | Discharge status: Home / Self Care | Payer: Medicare Other | Source: Ambulatory Visit | Attending: Vascular Surgery | Admitting: Vascular Surgery

## 2017-08-07 DIAGNOSIS — Z01812 Encounter for preprocedural laboratory examination: Secondary | ICD-10-CM

## 2017-08-07 DIAGNOSIS — I6523 Occlusion and stenosis of bilateral carotid arteries: Secondary | ICD-10-CM

## 2017-08-07 DIAGNOSIS — Z7902 Long term (current) use of antithrombotics/antiplatelets: Secondary | ICD-10-CM

## 2017-08-07 HISTORY — DX: Personal history of urinary calculi: Z87.442

## 2017-08-07 HISTORY — DX: Acute myocardial infarction, unspecified: I21.9

## 2017-08-07 HISTORY — DX: Atherosclerotic heart disease of native coronary artery without angina pectoris: I25.10

## 2017-08-07 LAB — BASIC METABOLIC PANEL
ANION GAP: 8 (ref 5–15)
BUN: 12 mg/dL (ref 6–20)
CALCIUM: 9.7 mg/dL (ref 8.9–10.3)
CO2: 29 mmol/L (ref 22–32)
CREATININE: 1.49 mg/dL — AB (ref 0.61–1.24)
Chloride: 104 mmol/L (ref 101–111)
GFR calc Af Amer: 52 mL/min — ABNORMAL LOW (ref >60)
GFR, EST NON AFRICAN AMERICAN: 44 mL/min — AB (ref >60)
GLUCOSE: 97 mg/dL (ref 65–99)
Potassium: 5 mmol/L (ref 3.5–5.1)
Sodium: 141 mmol/L (ref 135–145)

## 2017-08-08 MED ORDER — CEFAZOLIN SODIUM-DEXTROSE 1-4 GM/50ML-% IV SOLN: 1.0000 g | Freq: Once | INTRAVENOUS | Status: DC

## 2017-08-09 ENCOUNTER — Encounter
Admission: AD | Disposition: A | Discharge status: Skilled Nursing Facility | Payer: Self-pay | Source: Ambulatory Visit | Attending: Vascular Surgery

## 2017-08-09 ENCOUNTER — Inpatient Hospital Stay
Admission: AD | Admit: 2017-08-09 | Discharge: 2017-08-17 | DRG: 034 | Disposition: A | Discharge status: Skilled Nursing Facility | Payer: Medicare Other | Source: Ambulatory Visit | Attending: Vascular Surgery | Admitting: Vascular Surgery

## 2017-08-09 ENCOUNTER — Inpatient Hospital Stay: Payer: Medicare Other

## 2017-08-09 DIAGNOSIS — Z7982 Long term (current) use of aspirin: Secondary | ICD-10-CM | POA: Diagnosis not present

## 2017-08-09 DIAGNOSIS — I6521 Occlusion and stenosis of right carotid artery: Secondary | ICD-10-CM | POA: Diagnosis present

## 2017-08-09 DIAGNOSIS — Z8673 Personal history of transient ischemic attack (TIA), and cerebral infarction without residual deficits: Secondary | ICD-10-CM

## 2017-08-09 DIAGNOSIS — Z87442 Personal history of urinary calculi: Secondary | ICD-10-CM | POA: Diagnosis not present

## 2017-08-09 DIAGNOSIS — Z7902 Long term (current) use of antithrombotics/antiplatelets: Secondary | ICD-10-CM | POA: Diagnosis not present

## 2017-08-09 DIAGNOSIS — I739 Peripheral vascular disease, unspecified: Secondary | ICD-10-CM | POA: Diagnosis present

## 2017-08-09 DIAGNOSIS — G8194 Hemiplegia, unspecified affecting left nondominant side: Secondary | ICD-10-CM | POA: Diagnosis not present

## 2017-08-09 DIAGNOSIS — L7632 Postprocedural hematoma of skin and subcutaneous tissue following other procedure: Secondary | ICD-10-CM | POA: Diagnosis not present

## 2017-08-09 DIAGNOSIS — R000 Tachycardia, unspecified: Secondary | ICD-10-CM | POA: Diagnosis present

## 2017-08-09 DIAGNOSIS — I2510 Atherosclerotic heart disease of native coronary artery without angina pectoris: Secondary | ICD-10-CM | POA: Diagnosis present

## 2017-08-09 DIAGNOSIS — I714 Abdominal aortic aneurysm, without rupture: Secondary | ICD-10-CM | POA: Diagnosis present

## 2017-08-09 DIAGNOSIS — I10 Essential (primary) hypertension: Secondary | ICD-10-CM | POA: Diagnosis present

## 2017-08-09 DIAGNOSIS — I6523 Occlusion and stenosis of bilateral carotid arteries: Principal | ICD-10-CM | POA: Diagnosis present

## 2017-08-09 DIAGNOSIS — F17200 Nicotine dependence, unspecified, uncomplicated: Secondary | ICD-10-CM | POA: Diagnosis present

## 2017-08-09 DIAGNOSIS — I6529 Occlusion and stenosis of unspecified carotid artery: Secondary | ICD-10-CM

## 2017-08-09 DIAGNOSIS — I252 Old myocardial infarction: Secondary | ICD-10-CM

## 2017-08-09 DIAGNOSIS — I63433 Cerebral infarction due to embolism of bilateral posterior cerebral arteries: Secondary | ICD-10-CM | POA: Diagnosis not present

## 2017-08-09 DIAGNOSIS — R0902 Hypoxemia: Secondary | ICD-10-CM

## 2017-08-09 DIAGNOSIS — F1010 Alcohol abuse, uncomplicated: Secondary | ICD-10-CM | POA: Diagnosis present

## 2017-08-09 DIAGNOSIS — D649 Anemia, unspecified: Secondary | ICD-10-CM | POA: Diagnosis not present

## 2017-08-09 DIAGNOSIS — I638 Other cerebral infarction: Secondary | ICD-10-CM | POA: Diagnosis not present

## 2017-08-09 DIAGNOSIS — Z79899 Other long term (current) drug therapy: Secondary | ICD-10-CM | POA: Diagnosis not present

## 2017-08-09 DIAGNOSIS — Y838 Other surgical procedures as the cause of abnormal reaction of the patient, or of later complication, without mention of misadventure at the time of the procedure: Secondary | ICD-10-CM | POA: Diagnosis not present

## 2017-08-09 DIAGNOSIS — N289 Disorder of kidney and ureter, unspecified: Secondary | ICD-10-CM | POA: Diagnosis present

## 2017-08-09 DIAGNOSIS — R0989 Other specified symptoms and signs involving the circulatory and respiratory systems: Secondary | ICD-10-CM

## 2017-08-09 DIAGNOSIS — Z951 Presence of aortocoronary bypass graft: Secondary | ICD-10-CM

## 2017-08-09 DIAGNOSIS — I639 Cerebral infarction, unspecified: Secondary | ICD-10-CM

## 2017-08-09 DIAGNOSIS — Z5309 Procedure and treatment not carried out because of other contraindication: Secondary | ICD-10-CM

## 2017-08-09 HISTORY — PX: CAROTID PTA/STENT INTERVENTION: CATH118231

## 2017-08-09 LAB — CBC
HCT: 37.5 % — ABNORMAL LOW (ref 40.0–52.0)
Hemoglobin: 12.8 g/dL — ABNORMAL LOW (ref 13.0–18.0)
MCH: 36.2 pg — ABNORMAL HIGH (ref 26.0–34.0)
MCHC: 34.1 g/dL (ref 32.0–36.0)
MCV: 106.3 fL — ABNORMAL HIGH (ref 80.0–100.0)
PLATELETS: 249 10*3/uL (ref 150–440)
RBC: 3.53 MIL/uL — ABNORMAL LOW (ref 4.40–5.90)
RDW: 13.4 % (ref 11.5–14.5)
WBC: 13 10*3/uL — AB (ref 3.8–10.6)

## 2017-08-09 LAB — APTT: aPTT: 28 s (ref 24–36)

## 2017-08-09 LAB — PROTIME-INR
INR: 0.99
Prothrombin Time: 13 s (ref 11.4–15.2)

## 2017-08-09 LAB — GLUCOSE, CAPILLARY: Glucose-Capillary: 106 mg/dL — ABNORMAL HIGH (ref 65–99)

## 2017-08-09 LAB — POCT ACTIVATED CLOTTING TIME: Activated Clotting Time: 241 seconds

## 2017-08-09 SURGERY — CAROTID PTA/STENT INTERVENTION
Anesthesia: Moderate Sedation | Laterality: Right

## 2017-08-09 MED ORDER — FENTANYL CITRATE (PF) 100 MCG/2ML IJ SOLN
INTRAMUSCULAR | Status: AC
  Filled 2017-08-09: qty 2

## 2017-08-09 MED ORDER — ADULT MULTIVITAMIN W/MINERALS CH
1.0000 | ORAL_TABLET | Freq: Every day | ORAL | Status: DC
  Administered 2017-08-11 – 2017-08-17 (×5): 1 via ORAL
  Filled 2017-08-09 (×5): qty 1

## 2017-08-09 MED ORDER — ASPIRIN EC 325 MG PO TBEC
325.0000 mg | DELAYED_RELEASE_TABLET | Freq: Every day | ORAL | Status: DC
  Administered 2017-08-09: 325 mg via ORAL
  Filled 2017-08-09: qty 1

## 2017-08-09 MED ORDER — HEPARIN (PORCINE) IN NACL 2-0.9 UNIT/ML-% IJ SOLN
INTRAMUSCULAR | Status: AC
  Filled 2017-08-09: qty 1000

## 2017-08-09 MED ORDER — ACETAMINOPHEN 325 MG PO TABS
325.0000 mg | ORAL_TABLET | ORAL | Status: DC | PRN
  Filled 2017-08-09: qty 2

## 2017-08-09 MED ORDER — MORPHINE SULFATE (PF) 2 MG/ML IV SOLN: 2.0000 mg | INTRAVENOUS | Status: DC | PRN

## 2017-08-09 MED ORDER — MIDAZOLAM HCL 5 MG/5ML IJ SOLN
INTRAMUSCULAR | Status: AC
  Filled 2017-08-09: qty 5

## 2017-08-09 MED ORDER — ONDANSETRON HCL 4 MG/2ML IJ SOLN: 4.0000 mg | Freq: Four times a day (QID) | INTRAMUSCULAR | Status: DC | PRN

## 2017-08-09 MED ORDER — IOPAMIDOL (ISOVUE-300) INJECTION 61%
INTRAVENOUS | Status: DC | PRN
  Administered 2017-08-09: 90 mL via INTRAVENOUS

## 2017-08-09 MED ORDER — ASPIRIN EC 81 MG PO TBEC: 81.0000 mg | DELAYED_RELEASE_TABLET | Freq: Every day | ORAL | Status: DC

## 2017-08-09 MED ORDER — OXYCODONE-ACETAMINOPHEN 5-325 MG PO TABS: 1.0000 | ORAL_TABLET | ORAL | Status: DC | PRN

## 2017-08-09 MED ORDER — MAGNESIUM SULFATE 2 GM/50ML IV SOLN
2.0000 g | Freq: Every day | INTRAVENOUS | Status: DC | PRN
  Filled 2017-08-09: qty 50

## 2017-08-09 MED ORDER — ALUM & MAG HYDROXIDE-SIMETH 200-200-20 MG/5ML PO SUSP
15.0000 mL | ORAL | Status: DC | PRN
  Filled 2017-08-09: qty 30

## 2017-08-09 MED ORDER — SODIUM CHLORIDE 0.9 % IV SOLN: INTRAVENOUS | Status: DC

## 2017-08-09 MED ORDER — GUAIFENESIN-DM 100-10 MG/5ML PO SYRP
15.0000 mL | ORAL_SOLUTION | ORAL | Status: DC | PRN
  Filled 2017-08-09: qty 15

## 2017-08-09 MED ORDER — SODIUM CHLORIDE 0.9 % IV SOLN
INTRAVENOUS | Status: DC
  Administered 2017-08-09 – 2017-08-11 (×2): via INTRAVENOUS
  Administered 2017-08-12: 100 mL/h via INTRAVENOUS

## 2017-08-09 MED ORDER — SODIUM CHLORIDE 0.9 % IV SOLN: 500.0000 mL | Freq: Once | INTRAVENOUS | Status: DC | PRN

## 2017-08-09 MED ORDER — HEPARIN SODIUM (PORCINE) 1000 UNIT/ML IJ SOLN
INTRAMUSCULAR | Status: DC | PRN
  Administered 2017-08-09: 5000 [IU] via INTRAVENOUS
  Administered 2017-08-09: 2500 [IU] via INTRAVENOUS

## 2017-08-09 MED ORDER — FAMOTIDINE IN NACL 20-0.9 MG/50ML-% IV SOLN
20.0000 mg | Freq: Two times a day (BID) | INTRAVENOUS | Status: DC
  Administered 2017-08-09 – 2017-08-15 (×13): 20 mg via INTRAVENOUS
  Filled 2017-08-09 (×17): qty 50

## 2017-08-09 MED ORDER — ATORVASTATIN CALCIUM 20 MG PO TABS
40.0000 mg | ORAL_TABLET | Freq: Every day | ORAL | Status: DC
  Administered 2017-08-13 – 2017-08-17 (×5): 40 mg via ORAL
  Filled 2017-08-09 (×5): qty 2

## 2017-08-09 MED ORDER — HEPARIN (PORCINE) IN NACL 100-0.45 UNIT/ML-% IJ SOLN
1100.0000 [IU]/h | INTRAMUSCULAR | Status: DC
  Administered 2017-08-09: 800 [IU]/h via INTRAVENOUS
  Administered 2017-08-10: 700 [IU]/h via INTRAVENOUS
  Administered 2017-08-12: 900 [IU]/h via INTRAVENOUS
  Filled 2017-08-09 (×4): qty 250

## 2017-08-09 MED ORDER — LABETALOL HCL 5 MG/ML IV SOLN
10.0000 mg | INTRAVENOUS | Status: DC | PRN
  Administered 2017-08-09 (×2): 10 mg via INTRAVENOUS
  Filled 2017-08-09: qty 4

## 2017-08-09 MED ORDER — ACETAMINOPHEN 650 MG RE SUPP
325.0000 mg | RECTAL | Status: DC | PRN
  Administered 2017-08-09 – 2017-08-10 (×2): 650 mg via RECTAL
  Filled 2017-08-09 (×2): qty 1

## 2017-08-09 MED ORDER — METOPROLOL TARTRATE 25 MG PO TABS
25.0000 mg | ORAL_TABLET | Freq: Two times a day (BID) | ORAL | Status: DC
  Administered 2017-08-11 – 2017-08-17 (×9): 25 mg via ORAL
  Filled 2017-08-09 (×9): qty 1

## 2017-08-09 MED ORDER — PHENOL 1.4 % MT LIQD
1.0000 | OROMUCOSAL | Status: DC | PRN
  Administered 2017-08-13: 1 via OROMUCOSAL
  Filled 2017-08-09 (×2): qty 177

## 2017-08-09 MED ORDER — HYDRALAZINE HCL 20 MG/ML IJ SOLN
5.0000 mg | INTRAMUSCULAR | Status: DC | PRN
  Administered 2017-08-09: 5 mg via INTRAVENOUS
  Filled 2017-08-09: qty 1

## 2017-08-09 MED ORDER — ACETAMINOPHEN 500 MG PO TABS: 500.0000 mg | ORAL_TABLET | Freq: Four times a day (QID) | ORAL | Status: DC | PRN

## 2017-08-09 MED ORDER — DEXMEDETOMIDINE HCL IN NACL 400 MCG/100ML IV SOLN: 0.2000 ug/kg/h | INTRAVENOUS | Status: DC

## 2017-08-09 MED ORDER — LABETALOL HCL 5 MG/ML IV SOLN
INTRAVENOUS | Status: AC
  Administered 2017-08-09: 10 mg via INTRAVENOUS
  Filled 2017-08-09: qty 4

## 2017-08-09 MED ORDER — LORAZEPAM 2 MG/ML IJ SOLN
1.0000 mg | INTRAMUSCULAR | Status: DC | PRN
  Administered 2017-08-09 (×2): 1 mg via INTRAVENOUS
  Administered 2017-08-10 – 2017-08-12 (×7): 2 mg via INTRAVENOUS
  Administered 2017-08-12: 1 mg via INTRAVENOUS
  Administered 2017-08-13: 2 mg via INTRAVENOUS
  Administered 2017-08-14: 1 mg via INTRAVENOUS
  Filled 2017-08-09 (×12): qty 1

## 2017-08-09 MED ORDER — LIDOCAINE-EPINEPHRINE (PF) 2 %-1:200000 IJ SOLN
INTRAMUSCULAR | Status: AC
  Filled 2017-08-09: qty 20

## 2017-08-09 MED ORDER — POTASSIUM CHLORIDE CRYS ER 20 MEQ PO TBCR: 20.0000 meq | EXTENDED_RELEASE_TABLET | Freq: Every day | ORAL | Status: DC | PRN

## 2017-08-09 MED ORDER — DEXTROSE 5 % IV SOLN
1.5000 g | Freq: Two times a day (BID) | INTRAVENOUS | Status: AC
  Administered 2017-08-09 – 2017-08-10 (×2): 1.5 g via INTRAVENOUS
  Filled 2017-08-09 (×2): qty 1.5

## 2017-08-09 MED ORDER — FOLIC ACID 1 MG PO TABS: 1.0000 mg | ORAL_TABLET | Freq: Every day | ORAL | Status: DC

## 2017-08-09 MED ORDER — ATROPINE SULFATE 1 MG/10ML IJ SOSY
PREFILLED_SYRINGE | INTRAMUSCULAR | Status: AC
  Filled 2017-08-09: qty 10

## 2017-08-09 MED ORDER — FENTANYL CITRATE (PF) 100 MCG/2ML IJ SOLN
INTRAMUSCULAR | Status: DC | PRN
  Administered 2017-08-09: 25 ug via INTRAVENOUS
  Administered 2017-08-09 (×2): 50 ug via INTRAVENOUS
  Administered 2017-08-09: 25 ug via INTRAVENOUS

## 2017-08-09 MED ORDER — METOPROLOL TARTRATE 5 MG/5ML IV SOLN
2.0000 mg | INTRAVENOUS | Status: AC | PRN
  Administered 2017-08-09: 5 mg via INTRAVENOUS
  Administered 2017-08-11: 2 mg via INTRAVENOUS
  Filled 2017-08-09 (×3): qty 5

## 2017-08-09 MED ORDER — MIDAZOLAM HCL 2 MG/2ML IJ SOLN
INTRAMUSCULAR | Status: DC | PRN
Start: 2017-08-09 — End: 2017-08-09
  Administered 2017-08-09: 1 mg via INTRAVENOUS
  Administered 2017-08-09: 2 mg via INTRAVENOUS
  Administered 2017-08-09: 1 mg via INTRAVENOUS

## 2017-08-09 MED ORDER — PHENYLEPHRINE HCL 10 MG/ML IJ SOLN
INTRAMUSCULAR | Status: AC
  Filled 2017-08-09: qty 2

## 2017-08-09 MED ORDER — CLOPIDOGREL BISULFATE 75 MG PO TABS
75.0000 mg | ORAL_TABLET | Freq: Every day | ORAL | Status: DC
  Administered 2017-08-09 – 2017-08-17 (×5): 75 mg via ORAL
  Filled 2017-08-09 (×6): qty 1

## 2017-08-09 MED ORDER — SODIUM CHLORIDE 0.9 % IV SOLN
Freq: Once | INTRAVENOUS | Status: AC
  Administered 2017-08-09: 08:00:00 via INTRAVENOUS

## 2017-08-09 MED ORDER — VITAMIN B-1 100 MG PO TABS: 100.0000 mg | ORAL_TABLET | Freq: Every day | ORAL | Status: DC

## 2017-08-09 MED ORDER — ATROPINE SULFATE 1 MG/10ML IJ SOSY
PREFILLED_SYRINGE | INTRAMUSCULAR | Status: DC | PRN
  Administered 2017-08-09: .4 mg via INTRAVENOUS

## 2017-08-09 MED ORDER — SODIUM CHLORIDE 0.9 % IV BOLUS (SEPSIS)
INTRAVENOUS | Status: AC | PRN
  Administered 2017-08-09: 500 mL via INTRAVENOUS

## 2017-08-09 MED ORDER — HEPARIN SODIUM (PORCINE) 1000 UNIT/ML IJ SOLN
INTRAMUSCULAR | Status: AC
  Filled 2017-08-09: qty 1

## 2017-08-09 SURGICAL SUPPLY — 28 items
BALLN VIATRAC 5X20X135 (BALLOONS) ×3
BALLOON VIATRAC 5X20X135 (BALLOONS) ×1 IMPLANT
CATH 5FR JB2 100CM (CATHETERS) ×3 IMPLANT
CATH ANGIO 5F 100CM .035 PIG (CATHETERS) ×3 IMPLANT
CATH G 5FX100 (CATHETERS) ×3 IMPLANT
CATH INFINITI JR4 5F (CATHETERS) ×3 IMPLANT
CATH SIM1 100CM (CATHETERS) ×3 IMPLANT
CATH SIM1 5FR 125CM (CATHETERS) ×3 IMPLANT
CATH SIM2 100CM (CATHETERS) ×3 IMPLANT
CATH VTK 5FR 125CM BEACON TIP (CATHETERS) ×3 IMPLANT
COVER PROBE U/S 5X48 (MISCELLANEOUS) ×3 IMPLANT
DEVICE EMBOSHIELD NAV6 4.0-7.0 (WIRE) ×3 IMPLANT
DEVICE PRESTO INFLATION (MISCELLANEOUS) ×3 IMPLANT
DEVICE STARCLOSE SE CLOSURE (Vascular Products) ×3 IMPLANT
DEVICE TORQUE (MISCELLANEOUS) ×3 IMPLANT
GLIDECATH F4/38/100ST (CATHETERS) ×3 IMPLANT
GLIDEWIRE ANGLED SS 035X260CM (WIRE) ×3 IMPLANT
KIT CAROTID MANIFOLD (MISCELLANEOUS) ×3 IMPLANT
SHEATH BRITE TIP 6FRX11 (SHEATH) ×3 IMPLANT
SHEATH SHUTTLE 7FR (SHEATH) ×3 IMPLANT
SHEATH SHUTTLE SELECT 6F (SHEATH) ×3 IMPLANT
SHIELD RADPAD SCOOP 12X17 (MISCELLANEOUS) ×3 IMPLANT
STENT XACT CAR 9-7X40X136 (Permanent Stent) ×3 IMPLANT
STENT XACT CAR 9X30X136 (Permanent Stent) ×3 IMPLANT
TOWEL OR 17X26 4PK STRL BLUE (TOWEL DISPOSABLE) ×3 IMPLANT
TUBING CONTRAST HIGH PRESS 72 (TUBING) ×3 IMPLANT
WIRE AMPLATZ SSTIFF .035X260CM (WIRE) ×3 IMPLANT
WIRE J 3MM .035X145CM (WIRE) ×3 IMPLANT

## 2017-08-09 NOTE — Op Note (Signed)
OPERATIVE NOTE DATE: 08/09/2017  PROCEDURE: 1.  Ultrasound guidance for vascular access right femoral artery 2.  Placement of a 9 mm proximal 7 mm distal tapered 40 mm Exact stent and a 9 mm x 20 mm Exact stent extender with the use of the NAV-6 embolic protection device in the right carotid artery  PRE-OPERATIVE DIAGNOSIS: 1. Recurrent right carotid artery stenosis. 2. Previous TIA >5 years ago 3. AAA 4. PAD  POST-OPERATIVE DIAGNOSIS:  Same as above  SURGEON: Leotis Pain, MD  ASSISTANT(S):  none  ANESTHESIA: local/MCS  ESTIMATED BLOOD LOSS:  100 cc  CONTRAST: 90 cc  FLUORO TIME: 32 minutes  MODERATE CONSCIOUS SEDATION TIME:  Approximately 100 minutes using 4 mg of Versed and 125 mcg of Fentanyl  FINDING(S): 1.   High grade right carotid artery stenosis  SPECIMEN(S):   none  INDICATIONS:   Patient is a 76 y.o. male who presents with right carotid artery stenosis.  The patient has a previous history of carotid endarterectomy several years ago and has recurrent disease greater than 80% by CT angiogram and carotid artery stenting was felt to be preferred to endarterectomy for that reason.  Risks and benefits were discussed and informed consent was obtained.   DESCRIPTION: After obtaining full informed written consent, the patient was brought back to the vascular suite and placed supine upon the table.  The patient received IV antibiotics prior to induction. Moderate conscious sedation was administered during a face to face encounter with the patient throughout the procedure with my supervision of the RN administering medicines and monitoring the patients vital signs and mental status throughout from the start of the procedure until the patient was taken to the recovery room.  After obtaining adequate anesthesia, the patient was prepped and draped in the standard fashion.   The right femoral artery was visualized with ultrasound and found to be widely patent. It was then accessed  under direct ultrasound guidance without difficulty with a Seldinger needle. A permanent image was recorded. A J-wire was placed and we then placed a 6 French sheath. The patient was then heparinized and a total of 7500 units of intravenous heparin were given and an ACT was checked to confirm successful anticoagulation. A pigtail catheter was then placed into the ascending aorta. 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 I then tried a variety of catheters and wires including a JB2 catheter, a Benson Hanafee catheter, VTEK catheter, Simmons 1 and 2 catheters. Several times and was able to get a catheter into the innominate artery but was unable to get significant purchase into the common carotid artery. At one point, we exchanged for a glide catheter were able to advance this into the external carotid artery, but a stiff wire cause this to back out into the aorta. I then went back to the [facility] 1 catheter which seemed to have the best curve and gave [place] some purchase initially. It would not cross into the common carotid artery on the first pass, but we went back into the aorta and use magnified images to try to take the best path on cannulation. I then selectively cannulated the innominate artery with some difficulty with a Simmons 1 catheter and advanced into the mid right common carotid artery.  Cervical and cerebral carotid angiography was then performed. There were no obvious intracranial filling defects with sluggish flow in the anterior cerebral artery and normal flow in the middle cerebral artery. The carotid bifurcation demonstrated a >90% stenosis that  was likely as much as recurrent atherosclerosis as it was intimal hyperplasia.  I then advanced into the external carotid artery with a Glidewire and the Simmons 1 catheter and then exchanged for the Amplatz Super Stiff wire. Over the Amplatz Super Stiff wire, a 7 [place] shuttle sheath was placed into the mid common carotid artery. I then used the NAV-6   Embolic protection device and crossed the lesion and parked this in the distal internal carotid artery at the base of the skull.  I then selected a 9 mm proximal 7 mm distal 4 cm Exact stent. This was deployed across the lesion encompassing but a 9 mm extender was necessary to cover the lesion proximally. A 5 mm x 2 cm length balloon was used to post dilate the stent. Only about a 25% residual stenosis was present after angioplasty. Completion angiogram showed normal intracranial filling without new defects. At this point I elected to terminate the procedure. The sheath was removed and StarClose closure device was deployed in the right femoral artery with excellent hemostatic result. The patient was taken to the recovery room in stable condition having tolerated the procedure well.  COMPLICATIONS: none  CONDITION: stable  Leotis Pain 08/09/2017 11:54 AM   This note was created with Dragon Medical transcription system. Any errors in dictation are purely unintentional.

## 2017-08-09 NOTE — Progress Notes (Signed)
ANTICOAGULATION CONSULT NOTE - Initial Consult  Pharmacy Consult for Heparin Drip  Indication: stroke  No Known Allergies  Patient Measurements: Height: 5\' 10"  (177.8 cm) Weight: 154 lb 1.6 oz (69.9 kg) IBW/kg (Calculated) : 73  Vital Signs: Temp: 97.5 F (36.4 C) (09/13 1600) Temp Source: Oral (09/13 1600) BP: 152/117 (09/13 1729) Pulse Rate: 70 (09/13 1729)  Labs:  Recent Labs  08/07/17 1453 08/09/17 1727  HGB  --  12.8*  HCT  --  37.5*  PLT  --  249  CREATININE 1.49*  --     Estimated Creatinine Clearance: 43 mL/min (A) (by C-G formula based on SCr of 1.49 mg/dL (H)).   Medical History: Past Medical History:  Diagnosis Date  . Cancer ([place])   . Coronary artery disease   . History of kidney stones    40 years ago  . Hypertension   . Myocardial infarction [facility])     Assessment: 76 yo male with possible  ischemic stroke. Pharmacy consulted for heparin dosing and monitoring.  Goal of Therapy:  Heparin level 0.3-0.5 units/ml Monitor platelets by anticoagulation protocol: Yes   Plan:  No bolus for CVA Start heparin infusion at 800 units/hr Check anti-Xa level in 6 hours and daily while on heparin Continue to monitor H&H and platelets  Pernell Dupre, PharmD, BCPS Clinical Pharmacist 08/09/2017 5:59 PM

## 2017-08-09 NOTE — H&P (Signed)
[facility] History & Physical Update  The patient was interviewed and re-examined.  The patient's previous History and Physical has been reviewed and is unchanged.  There is no change in the plan of care. We plan to proceed with the scheduled procedure.  Leotis Pain, MD  08/09/2017, 7:29 AM

## 2017-08-09 NOTE — H&P (Signed)
[facility] Admission History & Physical  MRN : [id number]  Scott Larsen is a 76 y.o. (1941-02-07) male who presents with chief complaint of No chief complaint on file. 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 Had a previous endarterectomy several years ago. Also has a left carotid stenosis and 70% range. As undergone a CT angiogram which I have independently reviewed and has adequate anatomy for carotid stent placement. No new complaints since his last visit to the office.  Current Outpatient Prescriptions  Medication Sig Dispense Refill  . aspirin EC 325 MG tablet Take by mouth.    . benzonatate (TESSALON PERLES) 100 MG capsule Take 1 capsule (100 mg total) by mouth 3 (three) times daily as needed for cough. 15 capsule 0  . fluorouracil (EFUDEX) 5 % cream     . metoprolol tartrate (LOPRESSOR) 25 MG tablet     . simvastatin (ZOCOR) 20 MG tablet Take 20 mg by mouth daily.    Plavix 75 mg daily   Current Facility-Administered Medications  Medication Dose Route Frequency Provider Last Rate Last Dose  . ceFAZolin (ANCEF) IVPB 1 g/50 mL premix  1 g Intravenous Once Algernon Huxley, MD        Past Medical History:  Diagnosis Date  . Cancer ([place])   . Coronary artery disease   . History of kidney stones    40 years ago  . Hypertension   . Myocardial infarction [facility])     Past Surgical History:  Procedure Laterality Date  . APPENDECTOMY    . CAROTID ENDARTERECTOMY Right   . CORONARY ARTERY BYPASS GRAFT     triple bypass  . TONSILLECTOMY     Social History  Substance Use Topics  . Smoking status: Current Every Day Smoker  . Smokeless tobacco: Never Used  . Alcohol use Yes  No IV drug use  Family History No bleeding disorders, clotting disorders, autoimmune diseases, or aneurysms  No Known Allergies   REVIEW OF  SYSTEMS(Negative unless checked)  Constitutional: [] Weight loss[] Fever[] Chills Cardiac:[] Chest pain[] Chest pressure[] Palpitations [] Shortness of breath when laying flat [] Shortness of breath at rest [] Shortness of breath with exertion. Vascular: [] Pain in legs with walking[] Pain in legsat rest[] Pain in legs when laying flat [x] Claudication [] Pain in feet when walking [] Pain in feet at rest [] Pain in feet when laying flat [] History of DVT [] Phlebitis [] Swelling in legs [] Varicose veins [] Non-healing ulcers Pulmonary: [] Uses home oxygen [] Productive cough[] Hemoptysis [] Wheeze [] COPD [] Asthma Neurologic: [] Dizziness [] Blackouts [] Seizures [] History of stroke [] History of TIA[] Aphasia [] Temporary blindness[] Dysphagia [] Weaknessor numbness in arms [] Weakness or numbnessin legs Musculoskeletal: [x] Arthritis [] Joint swelling [] Joint pain [] Low back pain Hematologic:[] Easy bruising[] Easy bleeding [] Hypercoagulable state [] Anemic  Gastrointestinal:[] Blood in stool[] Vomiting blood[] Gastroesophageal reflux/heartburn[] Abdominal pain Genitourinary: [] Chronic kidney disease [] Difficulturination [] Frequenturination [] Burning with urination[] Hematuria Skin: [] Rashes [] Ulcers [] Wounds Psychological: [] History of anxiety[] History of major depression.    Physical Examination  There were no vitals filed for this visit. There is no height or weight on file to calculate BMI. Gen: WD/WN, NAD.  Head: [place]/AT, No temporalis wasting.  Ear/Nose/Throat: Hearing grossly intact, nares w/o erythema or drainage, oropharynx w/o Erythema/Exudate,  Eyes: Conjunctiva clear, sclera non-icteric Neck: Trachea midline.  No JVD.  Pulmonary:  Good air movement, respirations not labored, no use of accessory muscles.  Cardiac: RRR, normal S1, S2. Vascular:  Vessel Right Left  Radial Palpable Palpable  Ulnar  Palpable Palpable  Brachial Palpable Palpable  Carotid Palpable, with bruit Palpable, with bruit  Aorta Not palpable N/A  Femoral Palpable Palpable  Popliteal Palpable Palpable  PT 1+ Palpable 1+ Palpable  DP Palpable 1+ Palpable   Gastrointestinal: soft, non-tender/non-distended. Increased aortic impulse Musculoskeletal: M/S 5/5 throughout.  Extremities without ischemic changes.  No deformity or atrophy.  Neurologic: Sensation grossly intact in extremities.  Symmetrical.  Speech is fluent. Motor exam as listed above. Psychiatric: Judgment intact, Mood & affect appropriate for pt's clinical situation. Dermatologic: No rashes or ulcers noted.  No cellulitis or open wounds.      CBC No results found for: WBC, HGB, HCT, MCV, PLT  BMET    Component Value Date/Time   NA 141 08/07/2017 1453   K 5.0 08/07/2017 1453   CL 104 08/07/2017 1453   CO2 29 08/07/2017 1453   GLUCOSE 97 08/07/2017 1453   BUN 12 08/07/2017 1453   CREATININE 1.49 (H) 08/07/2017 1453   CREATININE 1.74 (H) 02/21/2012 0926   CALCIUM 9.7 08/07/2017 1453   GFRNONAA 44 (L) 08/07/2017 1453   GFRNONAA 42 (L) 02/21/2012 0926   GFRAA 52 (L) 08/07/2017 1453   GFRAA 50 (L) 02/21/2012 0926   Estimated Creatinine Clearance: 44.7 mL/min (A) (by C-G formula based on SCr of 1.49 mg/dL (H)).  COAG No results found for: INR, PROTIME  Radiology No results found.    Assessment/Plan  AAA (abdominal aortic aneurysm) without rupture (HCC) Slight increase in size now measuring 3.7 cm in maximal diameter. Smoking cessation and blood pressure control stressed. Recheck in 1 year.  PAD (peripheral artery disease) ([place]) His noninvasive studies recently show a right ABI of 1.0 and a left ABI of 0.88 with good wave forms. His iliac arteries have only mildly elevated velocities. His aneurysm now measures 3.7 cm in maximal diameter which is a slight progression from his previous study. No limb threatening symptoms. Continue  aspirin and statin agent. Recheck in 1 year.  Carotid stenosis He has undergone a CT angiogram which I have independently reviewed. He has about a 90% recurrent stenosis of the right carotid artery and about a 70% stenosis in the left carotid artery. Given this finding, He was started on Plavix and scheduled for right carotid artery stent which is to be performed today. Risks and benefits were discussed and he is agreeable to proceed.  Leotis Pain, MD  08/09/2017 7:36 AM

## 2017-08-09 NOTE — Progress Notes (Signed)
Patient to [facility] for recovery post-procedure, reporting no pain. >3 cm hematoma and bruising to right femoral site stable/improving throughout recovery time. Pedal pulses at pre-procedure baseline. Patient has notable neuro assessment findings since return to recovery area, see assessment flowsheet, including forgetfulness and disorientation to time, place, situation. Dr Lucky Cowboy notified while at bedside x 2 to see patient with no new orders at this time. PRN for elevated BP administered, reassessment pending. Resting quietly between care. Awaiting bed assignment in ICU for transfer.

## 2017-08-09 NOTE — Progress Notes (Signed)
[facility]  Daily Progress Note   Subjective  - Day of Surgery  Patient has become more confused and agitated.  Does have significant ETOH history with daily use at home.  Heavy tobacco use as well.  With MS changes, a CT of the head was done and was negative for bleeding  Objective Vitals:   08/09/17 1406 08/09/17 1500 08/09/17 1505 08/09/17 1600  BP: (!) 145/125  (!) 116/102 (!) 146/111  Pulse: 78 74 75 88  Resp: (!) 24 18 (!) 21   Temp: (!) 97.5 F (36.4 C)     TempSrc: Oral     SpO2: 92% 100% 100%   Weight: 69.9 kg (154 lb 1.6 oz)     Height: 5\' 10"  (1.778 m)       Intake/Output Summary (Last 24 hours) at 08/09/17 1704 Last data filed at 08/09/17 1500  Gross per 24 hour  Intake              300 ml  Output                0 ml  Net              300 ml    PULM  CTAB CV  RRR VASC  Access site bruised.  NEURO  Moves all four equally without deficits.  No facial droop.  But quite confused and not following commands now.  Laboratory CBC No results found for: WBC, HGB, HCT, PLT  BMET    Component Value Date/Time   NA 141 08/07/2017 1453   K 5.0 08/07/2017 1453   CL 104 08/07/2017 1453   CO2 29 08/07/2017 1453   GLUCOSE 97 08/07/2017 1453   BUN 12 08/07/2017 1453   CREATININE 1.49 (H) 08/07/2017 1453   CREATININE 1.74 (H) 02/21/2012 0926   CALCIUM 9.7 08/07/2017 1453   GFRNONAA 44 (L) 08/07/2017 1453   GFRNONAA 42 (L) 02/21/2012 0926   GFRAA 52 (L) 08/07/2017 1453   GFRAA 50 (L) 02/21/2012 0926    Assessment/Planning: POD #0 s/p right carotid stent placement   CT head negative so no bleeding present.  Will start heparin drip although not entirely clear etiology of MS changes.    Does not have typical stroke symptoms, but certainly his confusion and MS changes are new and embolic issues have to be considered  Keep pressure up to avoid malperfusion  Monitor in ICU    Leotis Pain  08/09/2017, 5:04 PM

## 2017-08-09 NOTE — Progress Notes (Addendum)
Pt arrived on unit alert but disoriented to time, situation and place. Patient was told several times that he was here because he had a carotid stent placed. Patient stated he had no recollection of this. Had to be reoriented many times. Patient also stated "everything looks very dark." Pt was unable to tell me what color his call button was or identify how many fingers I was holding up. Dr. Lucky Cowboy was notified and patient was taken to get a CT of the head. Shortly after coming back from the CT, patient became increasingly restless. Stating repeatedly that he needed to get out of bed. Dr. Lucky Cowboy notified of CT results and patient's increasingly agitated and confused state. Safety sitter ordered. Patient is to be started on a heparin drip. Patient has stated prior to his visit that he has three to four beers a day. CIWA protocol started.

## 2017-08-10 ENCOUNTER — Inpatient Hospital Stay: Payer: Medicare Other

## 2017-08-10 ENCOUNTER — Encounter: Payer: Self-pay | Admitting: Vascular Surgery

## 2017-08-10 LAB — BASIC METABOLIC PANEL
Anion gap: 7 (ref 5–15)
BUN: 13 mg/dL (ref 6–20)
CO2: 23 mmol/L (ref 22–32)
CREATININE: 1.57 mg/dL — AB (ref 0.61–1.24)
Calcium: 7.6 mg/dL — ABNORMAL LOW (ref 8.9–10.3)
Chloride: 111 mmol/L (ref 101–111)
GFR, EST AFRICAN AMERICAN: 48 mL/min — AB (ref >60)
GFR, EST NON AFRICAN AMERICAN: 42 mL/min — AB (ref >60)
Glucose, Bld: 110 mg/dL — ABNORMAL HIGH (ref 65–99)
Potassium: 4 mmol/L (ref 3.5–5.1)
SODIUM: 141 mmol/L (ref 135–145)

## 2017-08-10 LAB — CBC
HEMATOCRIT: 33.4 % — AB (ref 40.0–52.0)
Hemoglobin: 11.5 g/dL — ABNORMAL LOW (ref 13.0–18.0)
MCH: 36.3 pg — AB (ref 26.0–34.0)
MCHC: 34.4 g/dL (ref 32.0–36.0)
MCV: 105.6 fL — AB (ref 80.0–100.0)
PLATELETS: 212 10*3/uL (ref 150–440)
RBC: 3.16 MIL/uL — ABNORMAL LOW (ref 4.40–5.90)
RDW: 13.4 % (ref 11.5–14.5)
WBC: 10.7 10*3/uL — AB (ref 3.8–10.6)

## 2017-08-10 LAB — HEPARIN LEVEL (UNFRACTIONATED)
HEPARIN UNFRACTIONATED: 0.38 [IU]/mL (ref 0.30–0.70)
HEPARIN UNFRACTIONATED: 0.3 [IU]/mL (ref 0.30–0.70)
Heparin Unfractionated: 0.55 IU/mL (ref 0.30–0.70)

## 2017-08-10 MED ORDER — SODIUM CHLORIDE 0.9 % IV SOLN
INTRAVENOUS | Status: DC
  Administered 2017-08-10 – 2017-08-14 (×5): via INTRAVENOUS
  Filled 2017-08-10 (×8): qty 1

## 2017-08-10 MED ORDER — ASPIRIN 300 MG RE SUPP
300.0000 mg | Freq: Every day | RECTAL | Status: DC
  Administered 2017-08-10: 300 mg via RECTAL
  Filled 2017-08-10: qty 1

## 2017-08-10 NOTE — Progress Notes (Signed)
ANTICOAGULATION CONSULT NOTE - Initial Consult  Pharmacy Consult for Heparin Drip  Indication: stroke  No Known Allergies  Patient Measurements: Height: 5\' 10"  (177.8 cm) Weight: 154 lb 1.6 oz (69.9 kg) IBW/kg (Calculated) : 73  Vital Signs: Temp: 98.7 F (37.1 C) (09/14 1945) Temp Source: Axillary (09/14 1945) BP: 158/65 (09/14 2100) Pulse Rate: 75 (09/14 2100)  Labs:  Recent Labs  08/09/17 1727 08/10/17 0209 08/10/17 1312 08/10/17 2048  HGB 12.8* 11.5*  --   --   HCT 37.5* 33.4*  --   --   PLT 249 212  --   --   APTT 28  --   --   --   LABPROT 13.0  --   --   --   INR 0.99  --   --   --   HEPARINUNFRC  --  0.38 0.30 0.55  CREATININE  --  1.57*  --   --     Estimated Creatinine Clearance: 40.8 mL/min (A) (by C-G formula based on SCr of 1.57 mg/dL (H)).   Medical History: Past Medical History:  Diagnosis Date  . Cancer ([place])   . Coronary artery disease   . History of kidney stones    40 years ago  . Hypertension   . Myocardial infarction [facility])     Assessment: 76 yo male with possible  ischemic stroke. Pharmacy consulted for heparin dosing and monitoring.  Goal of Therapy:  Heparin level 0.3-0.5 units/ml Monitor platelets by anticoagulation protocol: Yes   Plan:  Will continue heparin drip at 800 units/hr and recheck a HL in 8 hours.   9/14:  HL @ 21:00 = 0.55.  Will decrease Heparin drip to 700 units/hr and recheck HL 8 hrs after rate change.   Orene Desanctis, PharmD Clinical Pharmacist 08/10/2017 10:04 PM

## 2017-08-10 NOTE — Progress Notes (Signed)
ANTICOAGULATION CONSULT NOTE - Initial Consult  Pharmacy Consult for Heparin Drip  Indication: stroke  No Known Allergies  Patient Measurements: Height: 5\' 10"  (177.8 cm) Weight: 154 lb 1.6 oz (69.9 kg) IBW/kg (Calculated) : 73  Vital Signs: Temp: 98.7 F (37.1 C) (09/14 0142) Temp Source: Axillary (09/14 0142) BP: 145/66 (09/14 0100) Pulse Rate: 85 (09/14 0100)  Labs:  Recent Labs  08/07/17 1453 08/09/17 1727 08/10/17 0209  HGB  --  12.8* 11.5*  HCT  --  37.5* 33.4*  PLT  --  249 212  APTT  --  28  --   LABPROT  --  13.0  --   INR  --  0.99  --   HEPARINUNFRC  --   --  0.38  CREATININE 1.49*  --  1.57*    Estimated Creatinine Clearance: 40.8 mL/min (A) (by C-G formula based on SCr of 1.57 mg/dL (H)).   Medical History: Past Medical History:  Diagnosis Date  . Cancer ([place])   . Coronary artery disease   . History of kidney stones    40 years ago  . Hypertension   . Myocardial infarction [facility])     Assessment: 76 yo male with possible  ischemic stroke. Pharmacy consulted for heparin dosing and monitoring.  Goal of Therapy:  Heparin level 0.3-0.5 units/ml Monitor platelets by anticoagulation protocol: Yes   Plan:  No bolus for CVA Start heparin infusion at 800 units/hr Check anti-Xa level in 6 hours and daily while on heparin Continue to monitor H&H and platelets   9/14 @ 0200 HL 0.38 therapeutic. Will continue current rate and will recheck next HL @ 1000. CBC trending down.  Tobie Lords, PharmD, BCPS Clinical Pharmacist 08/10/2017 3:22 AM

## 2017-08-10 NOTE — Progress Notes (Signed)
Pt's lung sounds have become progressively more ronchus. O2 sat are still in the 90's but have dropped from 98-100% to 93%. Patient has had a productive sounding cough, but has not produced any sputum. Patient also had a temp of 101 this morning but was given tylenol and currently has a temp of 98.1. Dr. Lucky Cowboy paged. Awaiting callback.

## 2017-08-10 NOTE — Progress Notes (Signed)
 [facility]  Daily Progress Note   Subjective  - 1 Day Post-Op  Patient remains lethargic, confused.   Has gotten ativan for agitation with strong ETOH history, and this has helped agitation but MS is poor.   Not able to get him to follow commands today   Objective Vitals:   08/10/17 1200 08/10/17 1300 08/10/17 1400 08/10/17 1500  BP: (!) 124/57 (!) 148/61 (!) 80/52 (!) 157/65  Pulse: 87 75 68 65  Resp: (!) 22 (!) 24 18 (!) 23  Temp:   98.4 F (36.9 C)   TempSrc:   Oral   SpO2: 94% 98% 96% 98%  Weight:      Height:        Intake/Output Summary (Last 24 hours) at 08/10/17 1509 Last data filed at 08/10/17 1400  Gross per 24 hour  Intake          2607.07 ml  Output                0 ml  Net          2607.07 ml    PULM  CTAB CV  RRR VASC  Bruising at access site is stable NEURO can not get him to follow commands.  Seems to be moving extremities, but difficult to assess. No obvious facial droop.   Laboratory CBC    Component Value Date/Time   WBC 10.7 (H) 08/10/2017 0209   HGB 11.5 (L) 08/10/2017 0209   HCT 33.4 (L) 08/10/2017 0209   PLT 212 08/10/2017 0209    BMET    Component Value Date/Time   NA 141 08/10/2017 0209   K 4.0 08/10/2017 0209   CL 111 08/10/2017 0209   CO2 23 08/10/2017 0209   GLUCOSE 110 (H) 08/10/2017 0209   BUN 13 08/10/2017 0209   CREATININE 1.57 (H) 08/10/2017 0209   CREATININE 1.74 (H) 02/21/2012 0926   CALCIUM 7.6 (L) 08/10/2017 0209   GFRNONAA 42 (L) 08/10/2017 0209   GFRNONAA 42 (L) 02/21/2012 0926   GFRAA 48 (L) 08/10/2017 0209   GFRAA 50 (L) 02/21/2012 0926    Assessment/Planning: POD #1 s/p right carotid stenting   MS is poor  Possible stroke although difficult to assess clinically at this point.  Had negative head CT yesterday.  May repeat this in 24-48 hours if no improvement.  Has strong ETOH history and has ativan for withdrawal symptoms ordered.  This could be contributing as well  Will keep  in ICU today/tonight  On heparin drip with no signs of bleeding at current.    Leotis Pain  08/10/2017, 3:09 PM

## 2017-08-10 NOTE — Progress Notes (Signed)
ANTICOAGULATION CONSULT NOTE - Initial Consult  Pharmacy Consult for Heparin Drip  Indication: stroke  No Known Allergies  Patient Measurements: Height: 5\' 10"  (177.8 cm) Weight: 154 lb 1.6 oz (69.9 kg) IBW/kg (Calculated) : 73  Vital Signs: Temp: 98.5 F (36.9 C) (09/14 1100) Temp Source: Oral (09/14 1100) BP: 148/61 (09/14 1300) Pulse Rate: 68 (09/14 1400)  Labs:  Recent Labs  08/07/17 1453 08/09/17 1727 08/10/17 0209 08/10/17 1312  HGB  --  12.8* 11.5*  --   HCT  --  37.5* 33.4*  --   PLT  --  249 212  --   APTT  --  28  --   --   LABPROT  --  13.0  --   --   INR  --  0.99  --   --   HEPARINUNFRC  --   --  0.38 0.30  CREATININE 1.49*  --  1.57*  --     Estimated Creatinine Clearance: 40.8 mL/min (A) (by C-G formula based on SCr of 1.57 mg/dL (H)).   Medical History: Past Medical History:  Diagnosis Date  . Cancer ([place])   . Coronary artery disease   . History of kidney stones    40 years ago  . Hypertension   . Myocardial infarction [facility])     Assessment: 76 yo male with possible  ischemic stroke. Pharmacy consulted for heparin dosing and monitoring.  Goal of Therapy:  Heparin level 0.3-0.5 units/ml Monitor platelets by anticoagulation protocol: Yes   Plan:  Will continue heparin drip at 800 units/hr and recheck a HL in 8 hours.   Napoleon Form, PharmD, BCPS Clinical Pharmacist 08/10/2017 2:04 PM

## 2017-08-11 ENCOUNTER — Inpatient Hospital Stay: Payer: Medicare Other

## 2017-08-11 DIAGNOSIS — I639 Cerebral infarction, unspecified: Secondary | ICD-10-CM

## 2017-08-11 LAB — BASIC METABOLIC PANEL
Anion gap: 5 (ref 5–15)
BUN: 15 mg/dL (ref 6–20)
CO2: 23 mmol/L (ref 22–32)
Calcium: 7.4 mg/dL — ABNORMAL LOW (ref 8.9–10.3)
Chloride: 112 mmol/L — ABNORMAL HIGH (ref 101–111)
Creatinine, Ser: 1.45 mg/dL — ABNORMAL HIGH (ref 0.61–1.24)
GFR calc Af Amer: 53 mL/min — ABNORMAL LOW (ref >60)
GFR, EST NON AFRICAN AMERICAN: 46 mL/min — AB (ref >60)
GLUCOSE: 85 mg/dL (ref 65–99)
Potassium: 4.4 mmol/L (ref 3.5–5.1)
Sodium: 140 mmol/L (ref 135–145)

## 2017-08-11 LAB — CBC
HCT: 30.3 % — ABNORMAL LOW (ref 40.0–52.0)
HEMOGLOBIN: 10.5 g/dL — AB (ref 13.0–18.0)
MCH: 36.9 pg — AB (ref 26.0–34.0)
MCHC: 34.8 g/dL (ref 32.0–36.0)
MCV: 106.1 fL — AB (ref 80.0–100.0)
Platelets: 177 10*3/uL (ref 150–440)
RBC: 2.85 MIL/uL — ABNORMAL LOW (ref 4.40–5.90)
RDW: 13.6 % (ref 11.5–14.5)
WBC: 11 10*3/uL — ABNORMAL HIGH (ref 3.8–10.6)

## 2017-08-11 LAB — HEPARIN LEVEL (UNFRACTIONATED)
HEPARIN UNFRACTIONATED: 0.23 [IU]/mL — AB (ref 0.30–0.70)
Heparin Unfractionated: 0.35 IU/mL (ref 0.30–0.70)
Heparin Unfractionated: 0.21 IU/mL — ABNORMAL LOW (ref 0.30–0.70)

## 2017-08-11 MED ORDER — METOPROLOL TARTRATE 5 MG/5ML IV SOLN
5.0000 mg | INTRAVENOUS | Status: DC | PRN
  Filled 2017-08-11: qty 5

## 2017-08-11 MED ORDER — METOPROLOL TARTRATE 5 MG/5ML IV SOLN
5.0000 mg | Freq: Four times a day (QID) | INTRAVENOUS | Status: DC
  Administered 2017-08-11 – 2017-08-14 (×12): 5 mg via INTRAVENOUS
  Filled 2017-08-11 (×11): qty 5

## 2017-08-11 MED ORDER — HEPARIN BOLUS VIA INFUSION
1050.0000 [IU] | Freq: Once | INTRAVENOUS | Status: AC
  Administered 2017-08-11: 1050 [IU] via INTRAVENOUS
  Filled 2017-08-11: qty 1050

## 2017-08-11 MED ORDER — HEPARIN BOLUS VIA INFUSION
1050.0000 [IU] | Freq: Once | INTRAVENOUS | Status: AC
  Administered 2017-08-12: 1050 [IU] via INTRAVENOUS
  Filled 2017-08-11: qty 1050

## 2017-08-11 NOTE — Progress Notes (Signed)
ANTICOAGULATION CONSULT NOTE - Initial Consult  Pharmacy Consult for Heparin Drip  Indication: stroke  No Known Allergies  Patient Measurements: Height: 5\' 10"  (177.8 cm) Weight: 154 lb 1.6 oz (69.9 kg) IBW/kg (Calculated) : 73  Vital Signs: Temp: 98.2 F (36.8 C) (09/14 2300) Temp Source: Axillary (09/14 2300) BP: 194/86 (09/15 0600) Pulse Rate: 98 (09/15 0600)  Labs:  Recent Labs  08/09/17 1727  08/10/17 0209 08/10/17 1312 08/10/17 2048 08/11/17 0545  HGB 12.8*  --  11.5*  --   --  10.5*  HCT 37.5*  --  33.4*  --   --  30.3*  PLT 249  --  212  --   --  177  APTT 28  --   --   --   --   --   LABPROT 13.0  --   --   --   --   --   INR 0.99  --   --   --   --   --   HEPARINUNFRC  --   < > 0.38 0.30 0.55 0.23*  CREATININE  --   --  1.57*  --   --   --   < > = values in this interval not displayed.  Estimated Creatinine Clearance: 40.8 mL/min (A) (by C-G formula based on SCr of 1.57 mg/dL (H)).   Medical History: Past Medical History:  Diagnosis Date  . Cancer ([place])   . Coronary artery disease   . History of kidney stones    40 years ago  . Hypertension   . Myocardial infarction [facility])     Assessment: 76 yo male with possible  ischemic stroke. Pharmacy consulted for heparin dosing and monitoring.  Goal of Therapy:  Heparin level 0.3-0.5 units/ml Monitor platelets by anticoagulation protocol: Yes   Plan:  Will continue heparin drip at 800 units/hr and recheck a HL in 8 hours.   9/14:  HL @ 21:00 = 0.55.  Will decrease Heparin drip to 700 units/hr and recheck HL 8 hrs after rate change.   9/15 @ 0545 HL 0.23 subtherapeutic. Will rebolus w/ heparin 1050 units IV x 1 and increase rate to 800 units/hr and will recheck HL @ 1400. CBC is trending down.  Tobie Lords, PharmD Clinical Pharmacist 08/11/2017 6:56 AM

## 2017-08-11 NOTE — Progress Notes (Signed)
ANTICOAGULATION CONSULT NOTE - Initial Consult  Pharmacy Consult for Heparin Drip  Indication: stroke  No Known Allergies  Patient Measurements: Height: 5\' 10"  (177.8 cm) Weight: 154 lb 1.6 oz (69.9 kg) IBW/kg (Calculated) : 73  Vital Signs: Temp: 98.6 F (37 C) (09/15 1600) Temp Source: Axillary (09/15 1600) BP: 144/68 (09/15 1900) Pulse Rate: 85 (09/15 1900)  Labs:  Recent Labs  08/09/17 1727 08/10/17 0209  08/11/17 0545 08/11/17 1355 08/11/17 2155  HGB 12.8* 11.5*  --  10.5*  --   --   HCT 37.5* 33.4*  --  30.3*  --   --   PLT 249 212  --  177  --   --   APTT 28  --   --   --   --   --   LABPROT 13.0  --   --   --   --   --   INR 0.99  --   --   --   --   --   HEPARINUNFRC  --  0.38  < > 0.23* 0.35 0.21*  CREATININE  --  1.57*  --  1.45*  --   --   < > = values in this interval not displayed.  Estimated Creatinine Clearance: 44.2 mL/min (A) (by C-G formula based on SCr of 1.45 mg/dL (H)).   Medical History: Past Medical History:  Diagnosis Date  . Cancer ([place])   . Coronary artery disease   . History of kidney stones    40 years ago  . Hypertension   . Myocardial infarction [facility])     Assessment: 76 yo male with possible  ischemic stroke. Pharmacy consulted for heparin dosing and monitoring.  Goal of Therapy:  Heparin level 0.3-0.5 units/ml Monitor platelets by anticoagulation protocol: Yes   Plan:  Will continue heparin drip at 800 units/hr and recheck a HL in 8 hours.   9/14:  HL @ 21:00 = 0.55.  Will decrease Heparin drip to 700 units/hr and recheck HL 8 hrs after rate change.   9/15 @ 0545 HL 0.23 subtherapeutic. Will rebolus w/ heparin 1050 units IV x 1 and increase rate to 800 units/hr and will recheck HL @ 1400. CBC is trending down.  9/15: Heparin level resulted @ 0.35. Will continue current heparin gtt rate. Will recheck Heparin level @2200 .   9/15 @ 2200 HL 0.21 subtherapeutic. Will rebolus w/ heparin 1050 units IV x 1 and will increase  rate to 900 units/hr and will recheck HL @ 0500. Will continue to monitor CBC.  Tobie Lords, PharmD Clinical Pharmacist 08/11/2017 11:48 PM

## 2017-08-11 NOTE — Progress Notes (Signed)
ANTICOAGULATION CONSULT NOTE - Initial Consult  Pharmacy Consult for Heparin Drip  Indication: stroke  No Known Allergies  Patient Measurements: Height: 5\' 10"  (177.8 cm) Weight: 154 lb 1.6 oz (69.9 kg) IBW/kg (Calculated) : 73  Vital Signs: Temp: 98 F (36.7 C) (09/15 0800) Temp Source: Axillary (09/15 0800) BP: 161/77 (09/15 1045) Pulse Rate: 91 (09/15 1045)  Labs:  Recent Labs  08/09/17 1727 08/10/17 0209  08/10/17 2048 08/11/17 0545 08/11/17 1355  HGB 12.8* 11.5*  --   --  10.5*  --   HCT 37.5* 33.4*  --   --  30.3*  --   PLT 249 212  --   --  177  --   APTT 28  --   --   --   --   --   LABPROT 13.0  --   --   --   --   --   INR 0.99  --   --   --   --   --   HEPARINUNFRC  --  0.38  < > 0.55 0.23* 0.35  CREATININE  --  1.57*  --   --  1.45*  --   < > = values in this interval not displayed.  Estimated Creatinine Clearance: 44.2 mL/min (A) (by C-G formula based on SCr of 1.45 mg/dL (H)).   Medical History: Past Medical History:  Diagnosis Date  . Cancer ([place])   . Coronary artery disease   . History of kidney stones    40 years ago  . Hypertension   . Myocardial infarction [facility])     Assessment: 76 yo male with possible  ischemic stroke. Pharmacy consulted for heparin dosing and monitoring.  Goal of Therapy:  Heparin level 0.3-0.5 units/ml Monitor platelets by anticoagulation protocol: Yes   Plan:  Will continue heparin drip at 800 units/hr and recheck a HL in 8 hours.   9/14:  HL @ 21:00 = 0.55.  Will decrease Heparin drip to 700 units/hr and recheck HL 8 hrs after rate change.   9/15 @ 0545 HL 0.23 subtherapeutic. Will rebolus w/ heparin 1050 units IV x 1 and increase rate to 800 units/hr and will recheck HL @ 1400. CBC is trending down.  9/15: Heparin level resulted @ 0.35. Will continue current heparin gtt rate. Will recheck Heparin level @2200 .   Larene Beach, PharmD Clinical Pharmacist 08/11/2017 3:13 PM

## 2017-08-11 NOTE — Progress Notes (Signed)
Central Tele reports 8 beat run of V-Tach. Pt asymptomatic  Dr Lucky Cowboy informed.  Order for BMP placed.  Will cotninue to monitor.

## 2017-08-11 NOTE — Consult Note (Signed)
History and Physical    Scott Larsen:[id number] DOB: Sep 05, 1941 DOA: 08/09/2017   Referring physician: Dr. Lucky Cowboy PCP: Patient, No Pcp Per  Specialists: none  Chief Complaint: confusion  HPI: Scott Larsen is a 76 y.o. male has a past medical history significant for CAD, ETOH use, and PVD now s/p right CEA now with acute confusion and speech/swallowing issues. Head CT shows posterior circulation infarcts which are new. The pt has been maintained on a heparin drip since his surgery 2 days ago. Currently he is lethargic and confused. Also tachycardic and hypertensive.  Review of Systems: unable to obtain due to confusion   Past Medical History:  Diagnosis Date  . Cancer ([place])   . Coronary artery disease   . History of kidney stones    40 years ago  . Hypertension   . Myocardial infarction [facility])    Past Surgical History:  Procedure Laterality Date  . APPENDECTOMY    . CAROTID ENDARTERECTOMY Right   . CAROTID PTA/STENT INTERVENTION Right 08/09/2017   Procedure: Carotid PTA/Stent Intervention;  Surgeon: Algernon Huxley, MD;  Location: [place] [facility];  Service: Cardiovascular;  Laterality: Right;  . CORONARY ARTERY BYPASS GRAFT     triple bypass  . TONSILLECTOMY     Social History:  reports that he has been smoking.  He has never used smokeless tobacco. He reports that he drinks alcohol. He reports that he does not use drugs.  No Known Allergies  No family history on file.  Prior to Admission medications   Medication Sig Start Date End Date Taking? Authorizing Provider  acetaminophen (TYLENOL) 500 MG tablet Take 500 mg by mouth every 6 (six) hours as needed for mild pain or headache.   Yes [provider]  aspirin EC 325 MG tablet Take 325 mg by mouth daily.    Yes [provider]  clopidogrel (PLAVIX) 75 MG tablet Take 75 mg by mouth daily. 07/10/17  Yes [provider]  metoprolol tartrate (LOPRESSOR) 25 MG tablet Take 25 mg by mouth 2  (two) times daily.  06/09/17  Yes [provider]  simvastatin (ZOCOR) 80 MG tablet Take 80 mg by mouth daily at 6 PM.   Yes [provider]   Physical Exam: Vitals:   08/11/17 1200 08/11/17 1300 08/11/17 1400 08/11/17 1500  BP: (!) 142/73 (!) 164/81 (!) 144/75   Pulse: 87 93 80 94  Resp: (!) 32 (!) 33 (!) 31 (!) 27  Temp: 98.2 F (36.8 C)     TempSrc: Axillary     SpO2: 93% 91% 95% 92%  Weight:      Height:         General:  No apparent distress, [place]/AT, WDWN  Eyes: PERRL, EOMI, no scleral icterus, conjunctiva clear  ENT: moist oropharynx  Neck: supple, no lymphadenopathy  Cardiovascular: rapid rate regular without MRG; 2+ peripheral pulses, no JVD, no peripheral edema  Respiratory: CTA biL, good air movement without wheezing, rhonchi or crackled. Respiratory effort normal  Abdomen: soft, non tender to palpation, positive bowel sounds, no guarding, no rebound  Skin: no rashes or lesions  Musculoskeletal: normal bulk and tone, no joint swelling  Psychiatric: lethargic, oriented to person only  Neurologic: CN 2-12 grossly intact, Motor strength 5/5 in all 4 groups with symmetric DTR's and non-focal sensory exam  Labs on Admission:  Basic Metabolic Panel:  Recent Labs Lab 08/07/17 1453 08/10/17 0209 08/11/17 0545  NA 141 141 140  K  5.0 4.0 4.4  CL 104 111 112*  CO2 29 23 23   GLUCOSE 97 110* 85  BUN 12 13 15   CREATININE 1.49* 1.57* 1.45*  CALCIUM 9.7 7.6* 7.4*   Liver Function Tests: No results for input(s): AST, ALT, ALKPHOS, BILITOT, PROT, ALBUMIN in the last 168 hours. No results for input(s): LIPASE, AMYLASE in the last 168 hours. No results for input(s): AMMONIA in the last 168 hours. CBC:  Recent Labs Lab 08/09/17 1727 08/10/17 0209 08/11/17 0545  WBC 13.0* 10.7* 11.0*  HGB 12.8* 11.5* 10.5*  HCT 37.5* 33.4* 30.3*  MCV 106.3* 105.6* 106.1*  PLT 249 212 177   Cardiac Enzymes: No results for input(s): CKTOTAL, CKMB,  CKMBINDEX, TROPONINI in the last 168 hours.  BNP (last 3 results) No results for input(s): BNP in the last 8760 hours.  ProBNP (last 3 results) No results for input(s): PROBNP in the last 8760 hours.  CBG:  Recent Labs Lab 08/09/17 1404  GLUCAP 106*    Radiological Exams on Admission: Ct Head Wo Contrast  Result Date: 08/11/2017 CLINICAL DATA:  Postop stent placement 2 days ago. Altered mental status. EXAM: CT HEAD WITHOUT CONTRAST TECHNIQUE: Contiguous axial images were obtained from the base of the skull through the vertex without intravenous contrast. COMPARISON:  08/09/2017.  07/05/2017. FINDINGS: Brain: Multiple newly seen infarctions affecting both cerebellar hemispheres. Newly seen infarction the left PCA territories affecting the posteromedial temporal lobes and occipital lobes left worse than right. Newly seen infarction in the right frontal cortical and subcortical brain. Old infarction right thalamus. No evidence of hemorrhage. No shift. No hydrocephalus. No extra-axial collection. Vascular: There is atherosclerotic calcification of the major vessels at the base of the brain. Skull: Normal Sinuses/Orbits: No acute inflammation. 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 Orbits negative. Other: None IMPRESSION: Newly seen infarctions affecting areas of both cerebellar hemispheres, both PCA territories of the posteromedial temporal lobes and occipital lobes left more than right, and the right frontal cortical and subcortical brain. 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 Electronically Signed   By: Nelson Chimes M.D.   On: 08/11/2017 15:08   Dg Chest Port 1 View  Result Date: 08/10/2017 CLINICAL DATA:  Rhonchi EXAM: PORTABLE CHEST 1 VIEW COMPARISON:  October 04, 2015 FINDINGS: There is interstitial prominence throughout the mid and lower lung zones. There is atelectatic change in the left base. There is no frank consolidation or appreciable  pleural effusion. Heart is upper normal in size with pulmonary vascularity within normal limits. There is aortic atherosclerosis. Patient is status post coronary artery bypass grafting. There is a stent in the right carotid artery. No focal bone lesions are evident. IMPRESSION: Suspect a degree of fibrotic change in the mid and lower lung zones. A slight degree of superimposed interstitial edema cannot be excluded. There is atelectasis in the left base. No consolidation, however. Heart is upper normal in size. There is aortic atherosclerosis. There is a stent in the right carotid artery. Aortic Atherosclerosis (ICD10-I70.0). Electronically Signed   By: Lowella Grip III M.D.   On: 08/10/2017 20:37    EKG: Independently reviewed.  Assessment/Plan Active Problems:   Carotid stenosis, right   Acute CVA (cerebrovascular accident) ([place])   Will continue Heparin drip and add low dose IV lopressor for now. Order echo and Carotid US. Neuro consult pending. Consult ST and PT. Will follow with you. May need MRA if echo negative.  Diet: NPO Fluids: per Surgery DVT  Prophylaxis: IV Heparin  Code Status: FULL  Family Communication: none  Disposition Plan: SNF  Time spent: 45 min

## 2017-08-11 NOTE — Progress Notes (Signed)
[facility]  Daily Progress Note   Subjective  - 2 Days Post-Op  Remains with altered mental status.  Moves all four, but not really following commands. Does seem to have some weakness on left leg which is new  Objective Vitals:   08/11/17 0700 08/11/17 0800 08/11/17 0900 08/11/17 1045  BP: (!) 150/70 (!) 158/74 (!) 147/80 (!) 161/77  Pulse: 94 (!) 101 86 91  Resp: (!) 21 19 (!) 26   Temp:  98 F (36.7 C)    TempSrc:  Axillary    SpO2: 93% 91% 96%   Weight:      Height:        Intake/Output Summary (Last 24 hours) at 08/11/17 1250 Last data filed at 08/11/17 0553  Gross per 24 hour  Intake           1973.6 ml  Output                0 ml  Net           1973.6 ml    PULM  Rhonchi present CV  RRR VASC  Feet with good cap refill, pulses not easily palpable  Laboratory CBC    Component Value Date/Time   WBC 11.0 (H) 08/11/2017 0545   HGB 10.5 (L) 08/11/2017 0545   HCT 30.3 (L) 08/11/2017 0545   PLT 177 08/11/2017 0545    BMET    Component Value Date/Time   NA 140 08/11/2017 0545   K 4.4 08/11/2017 0545   CL 112 (H) 08/11/2017 0545   CO2 23 08/11/2017 0545   GLUCOSE 85 08/11/2017 0545   BUN 15 08/11/2017 0545   CREATININE 1.45 (H) 08/11/2017 0545   CREATININE 1.74 (H) 02/21/2012 0926   CALCIUM 7.4 (L) 08/11/2017 0545   GFRNONAA 46 (L) 08/11/2017 0545   GFRNONAA 42 (L) 02/21/2012 0926   GFRAA 53 (L) 08/11/2017 0545   GFRAA 50 (L) 02/21/2012 0926    Assessment/Planning: POD #2 s/p right carotid stent   AMS continues  Repeat head CT today  Suspect stroke and possible ETOH withdrawal complicating situation    Leotis Pain  08/11/2017, 12:50 PM

## 2017-08-11 NOTE — Progress Notes (Signed)
Pt has remained alert with confusion, disoriented x 4. Repeat head CT today showed multiple infarcts. Pt has remained on RA. Pt has strong, productive cough. SpO2 > 90% on RA. NSR on cardiac monitor. BP has elevated a few times today-orders for metoprolol 5mg  PRN to be given with parameters. Safety sitter has remained at bedside. Girlfriend has been updated via telephone.

## 2017-08-12 ENCOUNTER — Inpatient Hospital Stay
Admission: AD | Admit: 2017-08-12 | Discharge: 2017-08-12 | Disposition: A | Discharge status: Home / Self Care | Payer: Medicare Other | Source: Ambulatory Visit | Attending: Internal Medicine | Admitting: Internal Medicine

## 2017-08-12 ENCOUNTER — Inpatient Hospital Stay: Payer: Medicare Other

## 2017-08-12 DIAGNOSIS — I6529 Occlusion and stenosis of unspecified carotid artery: Secondary | ICD-10-CM

## 2017-08-12 DIAGNOSIS — I63433 Cerebral infarction due to embolism of bilateral posterior cerebral arteries: Secondary | ICD-10-CM

## 2017-08-12 LAB — CBC
HCT: 30.6 % — ABNORMAL LOW (ref 40.0–52.0)
HEMOGLOBIN: 10.6 g/dL — AB (ref 13.0–18.0)
MCH: 37.1 pg — ABNORMAL HIGH (ref 26.0–34.0)
MCHC: 34.5 g/dL (ref 32.0–36.0)
MCV: 107.4 fL — ABNORMAL HIGH (ref 80.0–100.0)
PLATELETS: 168 10*3/uL (ref 150–440)
RBC: 2.85 MIL/uL — AB (ref 4.40–5.90)
RDW: 13.5 % (ref 11.5–14.5)
WBC: 11.6 10*3/uL — AB (ref 3.8–10.6)

## 2017-08-12 LAB — HEPARIN LEVEL (UNFRACTIONATED)
HEPARIN UNFRACTIONATED: 0.26 [IU]/mL — AB (ref 0.30–0.70)
HEPARIN UNFRACTIONATED: 0.3 [IU]/mL (ref 0.30–0.70)
Heparin Unfractionated: 0.33 IU/mL (ref 0.30–0.70)

## 2017-08-12 MED ORDER — SODIUM CHLORIDE 0.9 % IV SOLN: INTRAVENOUS | Status: AC

## 2017-08-12 NOTE — Progress Notes (Signed)
ANTICOAGULATION CONSULT NOTE - Initial Consult  Pharmacy Consult for Heparin Drip  Indication: stroke  No Known Allergies  Patient Measurements: Height: 5\' 10"  (177.8 cm) Weight: 154 lb 1.6 oz (69.9 kg) IBW/kg (Calculated) : 73  Vital Signs: Temp: 98.9 F (37.2 C) (09/16 0000) Temp Source: Oral (09/16 0000) BP: 145/80 (09/16 0300) Pulse Rate: 105 (09/16 0300)  Labs:  Recent Labs  08/09/17 1727 08/10/17 0209  08/11/17 0545 08/11/17 1355 08/11/17 2155 08/12/17 0506  HGB 12.8* 11.5*  --  10.5*  --   --  10.6*  HCT 37.5* 33.4*  --  30.3*  --   --  30.6*  PLT 249 212  --  177  --   --  168  APTT 28  --   --   --   --   --   --   LABPROT 13.0  --   --   --   --   --   --   INR 0.99  --   --   --   --   --   --   HEPARINUNFRC  --  0.38  < > 0.23* 0.35 0.21* 0.33  CREATININE  --  1.57*  --  1.45*  --   --   --   < > = values in this interval not displayed.  Estimated Creatinine Clearance: 44.2 mL/min (A) (by C-G formula based on SCr of 1.45 mg/dL (H)).   Medical History: Past Medical History:  Diagnosis Date  . Cancer ([place])   . Coronary artery disease   . History of kidney stones    40 years ago  . Hypertension   . Myocardial infarction [facility])     Assessment: 76 yo male with possible  ischemic stroke. Pharmacy consulted for heparin dosing and monitoring.  Goal of Therapy:  Heparin level 0.3-0.5 units/ml Monitor platelets by anticoagulation protocol: Yes   Plan:  Will continue heparin drip at 800 units/hr and recheck a HL in 8 hours.   9/14:  HL @ 21:00 = 0.55.  Will decrease Heparin drip to 700 units/hr and recheck HL 8 hrs after rate change.   9/15 @ 0545 HL 0.23 subtherapeutic. Will rebolus w/ heparin 1050 units IV x 1 and increase rate to 800 units/hr and will recheck HL @ 1400. CBC is trending down.  9/15: Heparin level resulted @ 0.35. Will continue current heparin gtt rate. Will recheck Heparin level @2200 .   9/15 @ 2200 HL 0.21 subtherapeutic. Will  rebolus w/ heparin 1050 units IV x 1 and will increase rate to 900 units/hr and will recheck HL @ 0500. Will continue to monitor CBC.  9/15 @ 0500 HL 0.33 therapeutic. Will continue current rate and will recheck HL @ 1300.  Tobie Lords, PharmD Clinical Pharmacist 08/12/2017 5:50 AM

## 2017-08-12 NOTE — Progress Notes (Signed)
PT Cancellation Note  Patient Details Name: Scott Larsen MRN: [id number] DOB: 08/27/1941   Cancelled Treatment:    Reason Eval/Treat Not Completed: Other (comment). Consult received and chart reviewed. Evaluation attempted, however pt just given Ativan and very somnolent at this time with safety sitter at bedside. Per RN, limited ability to follow commands this date. Unable to fully participate in therapy evaluation. Will re-attempt next date.   Kael Keetch 08/12/2017, 11:48 AM  Greggory Stallion, PT, DPT [phone]

## 2017-08-12 NOTE — Consult Note (Signed)
Reason for Consult:stroke Referring Physician: Dr. Lucky Cowboy  CC: posterior circulation strokes   HPI: Scott Larsen is an 76 y.o. male long history of multiple vascular issues who has recurrent right carotid artery stenosis of greater than 90%. Had a previous endarterectomy several years ago. Also has a left carotid stenosis and 70% range. Pt is day 2 s/p R carotid stenting   Past Medical History:  Diagnosis Date  . Cancer ([place])   . Coronary artery disease   . History of kidney stones    40 years ago  . Hypertension   . Myocardial infarction [facility])     Past Surgical History:  Procedure Laterality Date  . APPENDECTOMY    . CAROTID ENDARTERECTOMY Right   . CAROTID PTA/STENT INTERVENTION Right 08/09/2017   Procedure: Carotid PTA/Stent Intervention;  Surgeon: Algernon Huxley, MD;  Location: [place] [facility];  Service: Cardiovascular;  Laterality: Right;  . CORONARY ARTERY BYPASS GRAFT     triple bypass  . TONSILLECTOMY      No family history on file.  Social History:  reports that he has been smoking.  He has never used smokeless tobacco. He reports that he drinks alcohol. He reports that he does not use drugs.  No Known Allergies  Medications: I have reviewed the patient's current medications.  ROS: Unable to obtain due to confusion   Physical Examination: Blood pressure 137/68, pulse 95, temperature 98.8 F (37.1 C), temperature source Axillary, resp. rate (!) 32, height 5\' 10"  (1.778 m), weight 69.9 kg (154 lb 1.6 oz), SpO2 95 %.  H Neurological Examination   Mental Status: Alert, to name only . Cranial Nerves: II: Discs flat bilaterally; Visual fields grossly normal, pupils equal, round, reactive to light and accommodation III,IV, VI: ptosis not present, extra-ocular motions intact bilaterally V,VII: smile symmetric, facial light touch sensation normal bilaterally VIII: hearing normal bilaterally IX,X: gag reflex present XI: bilateral shoulder shrug XII: midline  tongue extension Motor: Right : Upper extremity   5/5    Left:     Upper extremity   2/5  Lower extremity   4/5     Lower extremity   3/5 Tone and bulk:normal tone throughout; no atrophy noted Sensory: Pinprick and light touch intact throughout, bilaterally Deep Tendon Reflexes: 1 and symmetric throughout Plantars: Right: downgoing   Left: downgoing Cerebellar: Not tested  Gait: not tested.       Laboratory Studies:   Basic Metabolic Panel:  Recent Labs Lab 08/07/17 1453 08/10/17 0209 08/11/17 0545  NA 141 141 140  K 5.0 4.0 4.4  CL 104 111 112*  CO2 29 23 23   GLUCOSE 97 110* 85  BUN 12 13 15   CREATININE 1.49* 1.57* 1.45*  CALCIUM 9.7 7.6* 7.4*    Liver Function Tests: No results for input(s): AST, ALT, ALKPHOS, BILITOT, PROT, ALBUMIN in the last 168 hours. No results for input(s): LIPASE, AMYLASE in the last 168 hours. No results for input(s): AMMONIA in the last 168 hours.  CBC:  Recent Labs Lab 08/09/17 1727 08/10/17 0209 08/11/17 0545 08/12/17 0506  WBC 13.0* 10.7* 11.0* 11.6*  HGB 12.8* 11.5* 10.5* 10.6*  HCT 37.5* 33.4* 30.3* 30.6*  MCV 106.3* 105.6* 106.1* 107.4*  PLT 249 212 177 168    Cardiac Enzymes: No results for input(s): CKTOTAL, CKMB, CKMBINDEX, TROPONINI in the last 168 hours.  BNP: Invalid input(s): POCBNP  CBG:  Recent Labs Lab 08/09/17 1404  GLUCAP 106*    Microbiology: No results found for  this or any previous visit.  Coagulation Studies:  Recent Labs  08/09/17 1727  LABPROT 13.0  INR 0.99    Urinalysis: No results for input(s): COLORURINE, LABSPEC, PHURINE, GLUCOSEU, HGBUR, BILIRUBINUR, KETONESUR, PROTEINUR, UROBILINOGEN, NITRITE, LEUKOCYTESUR in the last 168 hours.  Invalid input(s): APPERANCEUR  Lipid Panel:  No results found for: CHOL, TRIG, HDL, CHOLHDL, VLDL, LDLCALC  HgbA1C: No results found for: HGBA1C  Urine Drug Screen:  No results found for: LABOPIA, COCAINSCRNUR, LABBENZ, AMPHETMU, THCU, LABBARB   Alcohol Level: No results for input(s): ETH in the last 168 hours.  Other results: EKG: normal EKG, normal sinus rhythm, unchanged from previous tracings.  Imaging: Ct Head Wo Contrast  Result Date: 08/11/2017 CLINICAL DATA:  Postop stent placement 2 days ago. Altered mental status. EXAM: CT HEAD WITHOUT CONTRAST TECHNIQUE: Contiguous axial images were obtained from the base of the skull through the vertex without intravenous contrast. COMPARISON:  08/09/2017.  07/05/2017. FINDINGS: Brain: Multiple newly seen infarctions affecting both cerebellar hemispheres. Newly seen infarction the left PCA territories affecting the posteromedial temporal lobes and occipital lobes left worse than right. Newly seen infarction in the right frontal cortical and subcortical brain. Old infarction right thalamus. No evidence of hemorrhage. No shift. No hydrocephalus. No extra-axial collection. Vascular: There is atherosclerotic calcification of the major vessels at the base of the brain. Skull: Normal Sinuses/Orbits: No acute inflammation. 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 Orbits negative. Other: None IMPRESSION: Newly seen infarctions affecting areas of both cerebellar hemispheres, both PCA territories of the posteromedial temporal lobes and occipital lobes left more than right, and the right frontal cortical and subcortical brain. 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 Electronically Signed   By: Nelson Chimes M.D.   On: 08/11/2017 15:08   Dg Chest Port 1 View  Result Date: 08/10/2017 CLINICAL DATA:  Rhonchi EXAM: PORTABLE CHEST 1 VIEW COMPARISON:  October 04, 2015 FINDINGS: There is interstitial prominence throughout the mid and lower lung zones. There is atelectatic change in the left base. There is no frank consolidation or appreciable pleural effusion. Heart is upper normal in size with pulmonary vascularity within normal limits. There is aortic atherosclerosis.  Patient is status post coronary artery bypass grafting. There is a stent in the right carotid artery. No focal bone lesions are evident. IMPRESSION: Suspect a degree of fibrotic change in the mid and lower lung zones. A slight degree of superimposed interstitial edema cannot be excluded. There is atelectasis in the left base. No consolidation, however. Heart is upper normal in size. There is aortic atherosclerosis. There is a stent in the right carotid artery. Aortic Atherosclerosis (ICD10-I70.0). Electronically Signed   By: Lowella Grip III M.D.   On: 08/10/2017 20:37     Assessment/Plan:  76 y.o. male long history of multiple vascular issues who has recurrent right carotid artery stenosis of greater than 90%. Had a previous endarterectomy several years ago. Also has a left carotid stenosis and 70% range. Pt is day 2 s/p R carotid stenting   On examination pt has increased weakness on LUE and LLE CTH reviewed multiple supra and infratentorial strokes On ASA and Plavix MRI brian ordered and MRA as well Unclear why posterior circulation strokes given the fact that anterior circulation stented unless fetal PCAs Will possible need TEE in next few days to look for embolic source unless 2decho is sufficient Amaurie Wandel  08/12/2017, 1:37 PM

## 2017-08-12 NOTE — Progress Notes (Signed)
Pt arrived to the unit via bed with one to one sitter, [place], [place], in stable condition with no s/s of distress or discomfort, IVF Normal Saline infusing at 58ml/hr, heparin gtt infusing at 66ml/hr and rate changed upon arrival to 160ml/hr per pharmacy order. Pt stable, all safety measures in place, will continue to monitor. Harlene Ramus, RN

## 2017-08-12 NOTE — Progress Notes (Signed)
The MRI technician called  The primary RN at [facility] while the staff were in a code blue to bring the patient for MRI that was scheduled at 1336. The RN indicated to the technician there was no way she could  do that since she was the only RN on the floor monitoring the rest of the patients during the code. The RN called Dr. Leotis Pain to see if we could reschedule the MRI for the next day.  Dr. Lucky Cowboy indicated he wasn't the one who scheduled the MRI and as such he couldn't reschedule it. The supervisor for the MRI department also called indicated the technician was called in  to come and do the MRI and that he was concerned. The RN explained the reasons and he talked to the charged nurse which she (Camera operator) re- iliterate what was being said by the primary RN. The RN noted patient had stat abdominal X-Ray put in at [place]. We got another RN to come in to back [place] up . The unit secretary called all the 3 numbers that we know for the X-ray but no one answered it The back up RN was willing to take the patient to the do the X-ray as well as  the MRI but the MRI technician has left to go home at 2100 . No acute distress noted. The test will be done tomorrow morning when the technicians come in . Will continue to monitor.

## 2017-08-12 NOTE — Progress Notes (Signed)
[facility]  Daily Progress Note   Subjective  - 3 Days Post-Op  Remains somnolent and intermittently agitated  Objective Vitals:   08/12/17 0200 08/12/17 0300 08/12/17 0400 08/12/17 0500  BP: (!) 153/78 (!) 145/80 (!) 143/81 (!) 152/65  Pulse: 96 (!) 105 100 94  Resp: 17 (!) 36 (!) 33 (!) 37  Temp:   99.1 F (37.3 C)   TempSrc:   Oral   SpO2: 95% 94% 95% 95%  Weight:      Height:        Intake/Output Summary (Last 24 hours) at 08/12/17 0955 Last data filed at 08/12/17 0500  Gross per 24 hour  Intake          2940.71 ml  Output                0 ml  Net          2940.71 ml    PULM  Rhonchi present CV  RRR VASC  Access site bruising stable  Laboratory CBC    Component Value Date/Time   WBC 11.6 (H) 08/12/2017 0506   HGB 10.6 (L) 08/12/2017 0506   HCT 30.6 (L) 08/12/2017 0506   PLT 168 08/12/2017 0506    BMET    Component Value Date/Time   NA 140 08/11/2017 0545   K 4.4 08/11/2017 0545   CL 112 (H) 08/11/2017 0545   CO2 23 08/11/2017 0545   GLUCOSE 85 08/11/2017 0545   BUN 15 08/11/2017 0545   CREATININE 1.45 (H) 08/11/2017 0545   CREATININE 1.74 (H) 02/21/2012 0926   CALCIUM 7.4 (L) 08/11/2017 0545   GFRNONAA 46 (L) 08/11/2017 0545   GFRNONAA 42 (L) 02/21/2012 0926   GFRAA 53 (L) 08/11/2017 0545   GFRAA 50 (L) 02/21/2012 0926    Assessment/Planning: POD #3 s/p right carotid stent placement   CT shows strokes, posterior circulation which is more typical with heart and aortic pathology, not carotid disease although after procedure that would be more typical.  Appreciate IM input.  Neuro to see today  Will need suctioning for respiratory issues if remains tachypnic  Talked to girlfriend Scott Larsen today to update her    Scott Larsen  08/12/2017, 9:55 AM

## 2017-08-12 NOTE — Progress Notes (Signed)
ANTICOAGULATION CONSULT NOTE - Initial Consult  Pharmacy Consult for Heparin Drip  Indication: stroke  No Known Allergies  Patient Measurements: Height: 5\' 10"  (177.8 cm) Weight: 154 lb 1.6 oz (69.9 kg) IBW/kg (Calculated) : 73  Vital Signs: Temp: 100 F (37.8 C) (09/16 2030) Temp Source: Oral (09/16 2030) BP: 162/78 (09/16 2030) Pulse Rate: 89 (09/16 2032)  Labs:  Recent Labs  08/10/17 0209  08/11/17 0545  08/12/17 0506 08/12/17 1304 08/12/17 2223  HGB 11.5*  --  10.5*  --  10.6*  --   --   HCT 33.4*  --  30.3*  --  30.6*  --   --   PLT 212  --  177  --  168  --   --   HEPARINUNFRC 0.38  < > 0.23*  < > 0.33 0.26* 0.30  CREATININE 1.57*  --  1.45*  --   --   --   --   < > = values in this interval not displayed.  Estimated Creatinine Clearance: 44.2 mL/min (A) (by C-G formula based on SCr of 1.45 mg/dL (H)).   Medical History: Past Medical History:  Diagnosis Date  . Cancer ([place])   . Coronary artery disease   . History of kidney stones    40 years ago  . Hypertension   . Myocardial infarction [facility])     Assessment: 76 yo male with possible  ischemic stroke. Pharmacy consulted for heparin dosing and monitoring.  Goal of Therapy:  Heparin level 0.3-0.5 units/ml Monitor platelets by anticoagulation protocol: Yes   Plan:  Will continue heparin drip at 800 units/hr and recheck a HL in 8 hours.   9/14:  HL @ 21:00 = 0.55.  Will decrease Heparin drip to 700 units/hr and recheck HL 8 hrs after rate change.   9/15 @ 0545 HL 0.23 subtherapeutic. Will rebolus w/ heparin 1050 units IV x 1 and increase rate to 800 units/hr and will recheck HL @ 1400. CBC is trending down.  9/15: Heparin level resulted @ 0.35. Will continue current heparin gtt rate. Will recheck Heparin level @2200 .   9/15 @ 2200 HL 0.21 subtherapeutic. Will rebolus w/ heparin 1050 units IV x 1 and will increase rate to 900 units/hr and will recheck HL @ 0500. Will continue to monitor CBC.  9/15 @  0500 HL 0.33 therapeutic. Will continue current rate and will recheck HL @ 1300. 9/16: Heparin level resulted @ 0.26. Will increase heparin gtt rate to 1050 units/hr. Will recheck Heparin level @ 2230.   9/16 @ 2223 HL 0.30 therapeutic, but borderline, will increase rate to 1100 units/hr and recheck @ 0630.  Tobie Lords, PharmD Clinical Pharmacist 08/12/2017 11:38 PM

## 2017-08-12 NOTE — Progress Notes (Signed)
ANTICOAGULATION CONSULT NOTE - Initial Consult  Pharmacy Consult for Heparin Drip  Indication: stroke  No Known Allergies  Patient Measurements: Height: 5\' 10"  (177.8 cm) Weight: 154 lb 1.6 oz (69.9 kg) IBW/kg (Calculated) : 73  Vital Signs: Temp: 98.8 F (37.1 C) (09/16 0800) Temp Source: Axillary (09/16 0800) BP: 137/68 (09/16 1200) Pulse Rate: 95 (09/16 1300)  Labs:  Recent Labs  08/09/17 1727 08/10/17 0209  08/11/17 0545  08/11/17 2155 08/12/17 0506 08/12/17 1304  HGB 12.8* 11.5*  --  10.5*  --   --  10.6*  --   HCT 37.5* 33.4*  --  30.3*  --   --  30.6*  --   PLT 249 212  --  177  --   --  168  --   APTT 28  --   --   --   --   --   --   --   LABPROT 13.0  --   --   --   --   --   --   --   INR 0.99  --   --   --   --   --   --   --   HEPARINUNFRC  --  0.38  < > 0.23*  < > 0.21* 0.33 0.26*  CREATININE  --  1.57*  --  1.45*  --   --   --   --   < > = values in this interval not displayed.  Estimated Creatinine Clearance: 44.2 mL/min (A) (by C-G formula based on SCr of 1.45 mg/dL (H)).   Medical History: Past Medical History:  Diagnosis Date  . Cancer ([place])   . Coronary artery disease   . History of kidney stones    40 years ago  . Hypertension   . Myocardial infarction [facility])     Assessment: 76 yo male with possible  ischemic stroke. Pharmacy consulted for heparin dosing and monitoring.  Goal of Therapy:  Heparin level 0.3-0.5 units/ml Monitor platelets by anticoagulation protocol: Yes   Plan:  Will continue heparin drip at 800 units/hr and recheck a HL in 8 hours.   9/14:  HL @ 21:00 = 0.55.  Will decrease Heparin drip to 700 units/hr and recheck HL 8 hrs after rate change.   9/15 @ 0545 HL 0.23 subtherapeutic. Will rebolus w/ heparin 1050 units IV x 1 and increase rate to 800 units/hr and will recheck HL @ 1400. CBC is trending down.  9/15: Heparin level resulted @ 0.35. Will continue current heparin gtt rate. Will recheck Heparin level @2200 .    9/15 @ 2200 HL 0.21 subtherapeutic. Will rebolus w/ heparin 1050 units IV x 1 and will increase rate to 900 units/hr and will recheck HL @ 0500. Will continue to monitor CBC.  9/15 @ 0500 HL 0.33 therapeutic. Will continue current rate and will recheck HL @ 1300. 9/16: Heparin level resulted @ 0.26. Will increase heparin gtt rate to 1050 units/hr. Will recheck Heparin level @ 2230.   Larene Beach, PharmD Clinical Pharmacist 08/12/2017 1:55 PM

## 2017-08-12 NOTE — Progress Notes (Signed)
[place] at [place] NAME: Scott Larsen    MR#:  [id number]  DATE OF BIRTH:  03/23/1941  SUBJECTIVE: Patient is confused, earlier last night she he was trying to get out of the bed. Received Ativan last night. Confused, tachycardic. Sitter at bedside.   CHIEF COMPLAINT:  No chief complaint on file.   REVIEW OF SYSTEMS:   Review of Systems  Unable to perform ROS: Medical condition    DRUG ALLERGIES:  No Known Allergies  VITALS:  Blood pressure (!) 163/70, pulse 93, temperature 98.8 F (37.1 C), temperature source Axillary, resp. rate (!) 34, height 5\' 10"  (1.778 m), weight 69.9 kg (154 lb 1.6 oz), SpO2 94 %.  PHYSICAL EXAMINATION:  GENERAL:  76 y.o.-year-old patient lying in the bed,confuesd. EYES: Pupils equal, round, reactive to light   No scleral icterus.  HEENT: Head atraumatic, normocephalic. Oropharynx and nasopharynx clear.  NECK:  Supple, no jugular venous distention. No thyroid enlargement, no tenderness.  LUNGS: Faint expiratory wheeze in all lung fields. CARDIOVASCULAR: S1, S2 tachycardic.Marland Kitchen No murmurs, rubs, or gallops.  ABDOMEN: Soft, nontender, nondistended. Bowel sounds present. No organomegaly or mass.  EXTREMITIES: No pedal edema, cyanosis, or clubbing.  NEUROLOGIC: Confused due to stroke, unable to follow commands for neurological exam. PSYCHIATRIC: Confused. SKIN: No obvious rash, lesion, or ulcer.    LABORATORY PANEL:   CBC  Recent Labs Lab 08/12/17 0506  WBC 11.6*  HGB 10.6*  HCT 30.6*  PLT 168   ------------------------------------------------------------------------------------------------------------------  Chemistries   Recent Labs Lab 08/11/17 0545  NA 140  K 4.4  CL 112*  CO2 23  GLUCOSE 85  BUN 15  CREATININE 1.45*  CALCIUM 7.4*   ------------------------------------------------------------------------------------------------------------------  Cardiac Enzymes No  results for input(s): TROPONINI in the last 168 hours. ------------------------------------------------------------------------------------------------------------------  RADIOLOGY:  Ct Head Wo Contrast  Result Date: 08/11/2017 CLINICAL DATA:  Postop stent placement 2 days ago. Altered mental status. EXAM: CT HEAD WITHOUT CONTRAST TECHNIQUE: Contiguous axial images were obtained from the base of the skull through the vertex without intravenous contrast. COMPARISON:  08/09/2017.  07/05/2017. FINDINGS: Brain: Multiple newly seen infarctions affecting both cerebellar hemispheres. Newly seen infarction the left PCA territories affecting the posteromedial temporal lobes and occipital lobes left worse than right. Newly seen infarction in the right frontal cortical and subcortical brain. Old infarction right thalamus. No evidence of hemorrhage. No shift. No hydrocephalus. No extra-axial collection. Vascular: There is atherosclerotic calcification of the major vessels at the base of the brain. Skull: Normal Sinuses/Orbits: No acute inflammation. 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 Orbits negative. Other: None IMPRESSION: Newly seen infarctions affecting areas of both cerebellar hemispheres, both PCA territories of the posteromedial temporal lobes and occipital lobes left more than right, and the right frontal cortical and subcortical brain. 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 Electronically Signed   By: Nelson Chimes M.D.   On: 08/11/2017 15:08   Dg Chest Port 1 View  Result Date: 08/10/2017 CLINICAL DATA:  Rhonchi EXAM: PORTABLE CHEST 1 VIEW COMPARISON:  October 04, 2015 FINDINGS: There is interstitial prominence throughout the mid and lower lung zones. There is atelectatic change in the left base. There is no frank consolidation or appreciable pleural effusion. Heart is upper normal in size with pulmonary vascularity within normal limits. There is aortic  atherosclerosis. Patient is status post coronary artery bypass grafting. There is a stent in the right carotid artery. No focal  bone lesions are evident. IMPRESSION: Suspect a degree of fibrotic change in the mid and lower lung zones. A slight degree of superimposed interstitial edema cannot be excluded. There is atelectasis in the left base. No consolidation, however. Heart is upper normal in size. There is aortic atherosclerosis. There is a stent in the right carotid artery. Aortic Atherosclerosis (ICD10-I70.0). Electronically Signed   By: Lowella Grip III M.D.   On: 08/10/2017 20:37    EKG:   Orders placed or performed in visit on 11/10/13  . EKG 12-Lead    ASSESSMENT AND PLAN:   #1. 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 Confused  sinceright carotid surgery,. Initial CAT scan done in PACU was normal but repeat CAT scan done yesterday showed bilateral cerebellar infarcts. Patient already on heparin drip. Continue nothing by mouth, neurology consult. Prognosis is poor. Follow echocardiogram results. Patient can be moved to stroke unit. Continue heparin drip, continue sitter, start IV rehydration. Aspirin suppository, which therapy to see if he can swallow safely statins and Plavix. #2, essential hypertension:, Tachycardia now. Use IV beta blockers with Lopressor 5 mg every 6 hours as needed. #3. history of EtOH abuse. Patient will be started on CIWA protocol. #4/\. recent right carotid endarterectomy on September 13.  All the records are reviewed and case discussed with Care Management/Social Workerr. Management plans discussed with the patient, family and they are in agreement.  CODE STATUS: full  TOTAL TIME TAKING CARE OF THIS PATIENT: 35 minutes.   POSSIBLE D/C IN 1-2 DAYS, DEPENDING ON CLINICAL CONDITION.   Epifanio Lesches M.D on 08/12/2017 at 11:20 AM  Between 7am to 6pm - Pager - [phone]  After 6pm go to [email or web address] - password [email or web address]  Office  [phone]  CC: Primary care physician; Patient, No Pcp Per   Note: This dictation was prepared with Dragon dictation along with smaller phrase technology. Any transcriptional errors that result from this process are unintentional.

## 2017-08-13 ENCOUNTER — Inpatient Hospital Stay: Payer: Medicare Other

## 2017-08-13 LAB — HEPARIN LEVEL (UNFRACTIONATED)
HEPARIN UNFRACTIONATED: 0.33 [IU]/mL (ref 0.30–0.70)
Heparin Unfractionated: 0.25 IU/mL — ABNORMAL LOW (ref 0.30–0.70)
Heparin Unfractionated: 0.26 IU/mL — ABNORMAL LOW (ref 0.30–0.70)

## 2017-08-13 LAB — BLOOD GAS, ARTERIAL
Acid-base deficit: 8.4 mmol/L — ABNORMAL HIGH (ref 0.0–2.0)
Bicarbonate: 14.6 mmol/L — ABNORMAL LOW (ref 20.0–28.0)
FIO2: 0.28
O2 Saturation: 92 %
PCO2 ART: 23 mmHg — AB (ref 32.0–48.0)
PH ART: 7.41 (ref 7.350–7.450)
Patient temperature: 37
pO2, Arterial: 63 mmHg — ABNORMAL LOW (ref 83.0–108.0)

## 2017-08-13 LAB — CBC
HCT: 30.5 % — ABNORMAL LOW (ref 40.0–52.0)
Hemoglobin: 10.6 g/dL — ABNORMAL LOW (ref 13.0–18.0)
MCH: 37.8 pg — AB (ref 26.0–34.0)
MCHC: 34.8 g/dL (ref 32.0–36.0)
MCV: 108.7 fL — ABNORMAL HIGH (ref 80.0–100.0)
PLATELETS: 169 10*3/uL (ref 150–440)
RBC: 2.81 MIL/uL — ABNORMAL LOW (ref 4.40–5.90)
RDW: 13.7 % (ref 11.5–14.5)
WBC: 9.8 10*3/uL (ref 3.8–10.6)

## 2017-08-13 LAB — ECHOCARDIOGRAM COMPLETE
Height: 70 in
Weight: 2465.62 oz

## 2017-08-13 LAB — MAGNESIUM: Magnesium: 1.8 mg/dL (ref 1.7–2.4)

## 2017-08-13 LAB — POTASSIUM: POTASSIUM: 4.7 mmol/L (ref 3.5–5.1)

## 2017-08-13 MED ORDER — ALBUTEROL SULFATE (2.5 MG/3ML) 0.083% IN NEBU: 2.5000 mg | INHALATION_SOLUTION | RESPIRATORY_TRACT | Status: DC

## 2017-08-13 MED ORDER — ASPIRIN 81 MG PO CHEW
81.0000 mg | CHEWABLE_TABLET | Freq: Every day | ORAL | Status: DC
  Administered 2017-08-13 – 2017-08-17 (×4): 81 mg via ORAL
  Filled 2017-08-13 (×4): qty 1

## 2017-08-13 MED ORDER — HEPARIN (PORCINE) IN NACL 100-0.45 UNIT/ML-% IJ SOLN
1250.0000 [IU]/h | INTRAMUSCULAR | Status: DC
  Administered 2017-08-13: 1100 [IU]/h via INTRAVENOUS
  Administered 2017-08-14: 1250 [IU]/h via INTRAVENOUS
  Filled 2017-08-13: qty 250

## 2017-08-13 MED ORDER — FUROSEMIDE 10 MG/ML IJ SOLN
5.0000 mg | Freq: Once | INTRAMUSCULAR | Status: AC
  Administered 2017-08-13: 5 mg via INTRAVENOUS
  Filled 2017-08-13: qty 2

## 2017-08-13 MED ORDER — ALBUTEROL SULFATE (2.5 MG/3ML) 0.083% IN NEBU
2.5000 mg | INHALATION_SOLUTION | RESPIRATORY_TRACT | Status: DC | PRN
  Administered 2017-08-13: 2.5 mg via RESPIRATORY_TRACT
  Filled 2017-08-13 (×2): qty 3

## 2017-08-13 NOTE — Progress Notes (Signed)
ANTICOAGULATION CONSULT NOTE - Initial Consult  Pharmacy Consult for Heparin Drip  Indication: stroke  No Known Allergies  Patient Measurements: Height: 5\' 10"  (177.8 cm) Weight: 154 lb 1.6 oz (69.9 kg) IBW/kg (Calculated) : 73  Vital Signs: Temp: 98.2 F (36.8 C) (09/17 0739) Temp Source: Oral (09/17 0739) BP: 149/70 (09/17 0739) Pulse Rate: 81 (09/17 0739)  Labs:  Recent Labs  08/11/17 0545  08/12/17 0506 08/12/17 1304 08/12/17 2223 08/13/17 0623  HGB 10.5*  --  10.6*  --   --  10.6*  HCT 30.3*  --  30.6*  --   --  30.5*  PLT 177  --  168  --   --  169  HEPARINUNFRC 0.23*  < > 0.33 0.26* 0.30 0.33  CREATININE 1.45*  --   --   --   --   --   < > = values in this interval not displayed.  Estimated Creatinine Clearance: 44.2 mL/min (A) (by C-G formula based on SCr of 1.45 mg/dL (H)).   Medical History: Past Medical History:  Diagnosis Date  . Cancer ([place])   . Coronary artery disease   . History of kidney stones    40 years ago  . Hypertension   . Myocardial infarction [facility])     Assessment: 76 yo male with possible  ischemic stroke. Pharmacy consulted for heparin dosing and monitoring.  Goal of Therapy:  Heparin level 0.3-0.5 units/ml Monitor platelets by anticoagulation protocol: Yes   Plan:  Will continue heparin drip at 800 units/hr and recheck a HL in 8 hours.   9/14:  HL @ 21:00 = 0.55.  Will decrease Heparin drip to 700 units/hr and recheck HL 8 hrs after rate change.   9/15 @ 0545 HL 0.23 subtherapeutic. Will rebolus w/ heparin 1050 units IV x 1 and increase rate to 800 units/hr and will recheck HL @ 1400. CBC is trending down.  9/15: Heparin level resulted @ 0.35. Will continue current heparin gtt rate. Will recheck Heparin level @2200 .   9/15 @ 2200 HL 0.21 subtherapeutic. Will rebolus w/ heparin 1050 units IV x 1 and will increase rate to 900 units/hr and will recheck HL @ 0500. Will continue to monitor CBC.  9/15 @ 0500 HL 0.33 therapeutic.  Will continue current rate and will recheck HL @ 1300. 9/16: Heparin level resulted @ 0.26. Will increase heparin gtt rate to 1050 units/hr. Will recheck Heparin level @ 2230.   9/16 @ 2223 HL 0.30 therapeutic, but borderline, will increase rate to 1100 units/hr and recheck @ 0630.  9/17 0623 HL therapeutic x 1 (last result was therapeutic as well, but the rate was increased). Will recheck HL in 8 hours.  Laural Benes, Pharm.D., BCPS Clinical Pharmacist 08/13/2017 8:09 AM

## 2017-08-13 NOTE — Progress Notes (Signed)
Notified Dr. Delana Meyer about patient's wheeziness and tachypnea, patient was 94% in 2L of oxygen, order given for albuterol treatment PRN. Also talked about NPO status, per MD will keep NPO and address in the morning. Patient still somnolent but arousable to voice. No other concern at the moment. Rn will continue to monitor.

## 2017-08-13 NOTE — Progress Notes (Signed)
[place] at [place] NAME: Scott Larsen    MR#:  [id number]  DATE OF BIRTH:  September 26, 1941  SUBJECTIVE: patient is seen at bedside, more alert than yesterday. Sat  On at the edge of bed with physical therapy.   CHIEF COMPLAINT:  No chief complaint on file.   REVIEW OF SYSTEMS:   Review of Systems  Unable to perform ROS: Medical condition    DRUG ALLERGIES:  No Known Allergies  VITALS:  Blood pressure (!) 152/74, pulse 72, temperature 98.7 F (37.1 C), temperature source Oral, resp. rate (!) 28, height 5\' 10"  (1.778 m), weight 69.9 kg (154 lb 1.6 oz), SpO2 99 %.  PHYSICAL EXAMINATION:  GENERAL:  76 y.o.-year-old patient lying in the bed,confuesd. EYES: Pupils equal, round, reactive to light   No scleral icterus.  HEENT: Head atraumatic, normocephalic. Oropharynx and nasopharynx clear.  NECK:  Supple, no jugular venous distention. No thyroid enlargement, no tenderness.  LUNGS:  clearto auscultation. CARDIOVASCULAR: S1, S2 tachycardic.Marland Kitchen No murmurs, rubs, or gallops.  ABDOMEN: Soft, nontender, nondistended. Bowel sounds present. No organomegaly or mass.  EXTREMITIES: No pedal edema, cyanosis, or clubbing.  NEUROLOGIC: Confused due to stroke, unable to follow commands for neurological exam. PSYCHIATRIC: Confused. SKIN: No obvious rash, lesion, or ulcer.    LABORATORY PANEL:   CBC  Recent Labs Lab 08/13/17 0623  WBC 9.8  HGB 10.6*  HCT 30.5*  PLT 169   ------------------------------------------------------------------------------------------------------------------  Chemistries   Recent Labs Lab 08/11/17 0545  NA 140  K 4.4  CL 112*  CO2 23  GLUCOSE 85  BUN 15  CREATININE 1.45*  CALCIUM 7.4*   ------------------------------------------------------------------------------------------------------------------  Cardiac Enzymes No results for input(s): TROPONINI in the last 168  hours. ------------------------------------------------------------------------------------------------------------------  RADIOLOGY:  Dg Abd 1 View  Result Date: 08/13/2017 CLINICAL DATA:  MRI clearance. EXAM: ABDOMEN - 1 VIEW COMPARISON:  None. FINDINGS: The bowel gas pattern is normal. No radio-opaque calculi or other significant radiographic abnormality are seen. Right common iliac stent. Atherosclerotic vascular calcifications. Prior CABG. IMPRESSION: 1. Right common iliac artery stent. 2. Nonobstructive bowel gas pattern. Electronically Signed   By: Titus Dubin M.D.   On: 08/13/2017 09:03   Ct Head Wo Contrast  Result Date: 08/11/2017 CLINICAL DATA:  Postop stent placement 2 days ago. Altered mental status. EXAM: CT HEAD WITHOUT CONTRAST TECHNIQUE: Contiguous axial images were obtained from the base of the skull through the vertex without intravenous contrast. COMPARISON:  08/09/2017.  07/05/2017. FINDINGS: Brain: Multiple newly seen infarctions affecting both cerebellar hemispheres. Newly seen infarction the left PCA territories affecting the posteromedial temporal lobes and occipital lobes left worse than right. Newly seen infarction in the right frontal cortical and subcortical brain. Old infarction right thalamus. No evidence of hemorrhage. No shift. No hydrocephalus. No extra-axial collection. Vascular: There is atherosclerotic calcification of the major vessels at the base of the brain. Skull: Normal Sinuses/Orbits: No acute inflammation. 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 Orbits negative. Other: None IMPRESSION: Newly seen infarctions affecting areas of both cerebellar hemispheres, both PCA territories of the posteromedial temporal lobes and occipital lobes left more than right, and the right frontal cortical and subcortical brain. 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 Electronically Signed   By: Nelson Chimes M.D.   On: 08/11/2017 15:08    US Carotid Bilateral  Result Date: 08/13/2017 CLINICAL DATA:  Stroke. Recent right carotid artery stent placement. EXAM: BILATERAL CAROTID DUPLEX  ULTRASOUND TECHNIQUE: Pearline Cables scale imaging, color Doppler and duplex ultrasound were performed of bilateral carotid and vertebral arteries in the neck. COMPARISON:  Neck CTA 07/05/2017 Ing carotid duplex 03/21/2010 FINDINGS: Criteria: Quantification of carotid stenosis is based on velocity parameters that correlate the residual internal carotid diameter with NASCET-based stenosis levels, using the diameter of the distal internal carotid lumen as the denominator for stenosis measurement. The following velocity measurements were obtained: RIGHT ICA:  86 cm/sec CCA:  361 cm/sec SYSTOLIC ICA/CCA RATIO:  0.5 DIASTOLIC ICA/CCA RATIO:  0.8 ECA:  105 cm/sec LEFT ICA:  305 cm/sec CCA:  443 cm/sec SYSTOLIC ICA/CCA RATIO:  2.3 DIASTOLIC ICA/CCA RATIO:  2.5 ECA:  226 cm/sec RIGHT CAROTID ARTERY: Diffuse atherosclerotic plaque in the right common carotid artery. There is a vascular stent at the right carotid bulb. Stent is widely patent. External carotid artery is patent with normal waveform. Evaluation of the internal carotid artery is limited but the internal carotid artery is patent with normal waveforms and velocities. RIGHT VERTEBRAL ARTERY: Antegrade flow and normal waveform in the right vertebral artery. LEFT CAROTID ARTERY: Extensive plaque throughout the left common carotid artery. Large amount of shadowing plaque at the left carotid bulb which limits evaluation of this area. Peak systolic velocity in the external carotid artery is mildly elevated. Extensive calcified plaque in the proximal internal carotid artery with limited evaluation. Peak systolic velocity in the proximal internal carotid artery is elevated measuring up to 305 cm/sec. Mid and distal internal carotid artery are patent. 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 Right carotid artery stent is patent. 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 Antegrade flow in the right vertebral artery. Electronically Signed   By: Markus Daft M.D.   On: 08/13/2017 07:54    EKG:   Orders placed or performed in visit on 11/10/13  . EKG 12-Lead    ASSESSMENT AND PLAN:   #1. 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 Confused  Since right carotid surgery,. Initial CAT scan done in PACU was normal but repeat CAT scan done yesterday showed bilateral cerebellar infarcts. On aspirin, Plavix now. .speechtherapy recommended dysphagia 1 diet. neurology consult. Prognosis is poor. Follow echocardiogram results.  Continue heparin drip, continue sitter, start IV hydration. Start oral aspirin MRI, MRA of the brain as per neurology recommendation Follow-up echocardiogram, possible need for TEE to evaluate for embolic strokes.  #2, essential hypertension:, Tachycardia now. Use IV beta blockers with Lopressor 5 mg every 6 hours as needed.his by mouth metoprolol 25 mg twice a day.  #3. history of EtOH abuse. Patient will be started on CIWA protocol.continue Thiamine, folic acid. #4/\. recent right carotid endarterectomy on September 13.  All the records are reviewed and case discussed with Care Management/Social Workerr. Management plans discussed with the patient, family and they are in agreement.  CODE STATUS: full  TOTAL TIME TAKING CARE OF THIS PATIENT: 35 minutes.   POSSIBLE D/C IN 1-2 DAYS, DEPENDING ON CLINICAL CONDITION.   Epifanio Lesches M.D on 08/13/2017 at 12:56 PM  Between 7am to 6pm - Pager - [phone]  After 6pm go to [email or web address] - password  [email or web address]  [facility] [facility]  Office  [phone]  CC: Primary  care physician; Patient, No Pcp Per   Note: This dictation was prepared with Dragon dictation along with smaller phrase technology. Any transcriptional errors that result from this process are unintentional.

## 2017-08-13 NOTE — Evaluation (Signed)
Clinical/Bedside Swallow Evaluation Patient Details  Name: Scott Larsen MRN: [id number] Date of Birth: 16-Sep-1941  Today's Date: 08/13/2017 Time: SLP Start Time (ACUTE ONLY): 52 SLP Stop Time (ACUTE ONLY): 1105 SLP Time Calculation (min) (ACUTE ONLY): 45 min  Past Medical History:  Past Medical History:  Diagnosis Date  . Cancer ([place])   . Coronary artery disease   . History of kidney stones    40 years ago  . Hypertension   . Myocardial infarction [facility])    Past Surgical History:  Past Surgical History:  Procedure Laterality Date  . APPENDECTOMY    . CAROTID ENDARTERECTOMY Right   . CAROTID PTA/STENT INTERVENTION Right 08/09/2017   Procedure: Carotid PTA/Stent Intervention;  Surgeon: Algernon Huxley, MD;  Location: [facility];  Service: Cardiovascular;  Laterality: Right;  . CORONARY ARTERY BYPASS GRAFT     triple bypass  . TONSILLECTOMY     HPI:  Pt is a 76 y.o. male has a past medical history significant for CAD, current ETOH use, and PVD now s/p right CEA now with acute confusion and speech/swallowing issues. Head CT shows posterior circulation infarcts which are new. The pt has been maintained on a heparin drip since his surgery 2 days ago. Currently he is lethargic and confused.; also tachycardic and hypertensive post op. 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 Had a previous endarterectomy several years ago. Also has a left carotid stenosis and 70% range. Pt remains confused w/ decreased alertness and attention.    Assessment / Plan / Recommendation Clinical Impression  Pt appears to present w/ oropharyngeal phase dysphagia impacted by declined Cognitive and medical status' overall. Pt required moderate verbal/tactile cues consistently throughout the session to attend to task of oral intake. Pt consumed trials of ice chips exhibiting overt s/s of aspiration; then trials of Nectar liquids via  straw and purees w/ no immediate, overt s/s of aspiration noted; vocal quality clear b/t trials. Of note, pt's respiratory status was c/b shallow, increased RR during session w/ any stimulation - suspect related to Neurological status currently. During the oral phase, increased oral phase time for bolus formation, transfer for swallowing, and oral clearing w/ all trial consistencies - pt required moderate tactile/verbal cues to utilize cup/straw drinking frequently. Pt appears at reduced Cognitive status which appears to impact his attention to task of swallowing/oral intake. He requires moderate cues to follow through w/ swallowing tasks/oral intake. Pt is at risk for aspiration d/t cognitive and medicl status' and would benefit from a modified diet. Recommend a dysphagia level 1 w/ Nectar liquids; aspiration precautions; feeding assistance w/ all po's; Pills in puree - Crushed as able.  SLP Visit Diagnosis: Dysphagia, oropharyngeal phase (R13.12)    Aspiration Risk  Mild aspiration risk;Moderate aspiration risk    Diet Recommendation  Dysphagia level 1 (puree) w/ Nectar liquids; aspiration precautions  Medication Administration: Crushed with puree    Other  Recommendations Recommended Consults:  (Dietician f/u) Oral Care Recommendations: Oral care BID;Staff/trained caregiver to provide oral care Other Recommendations: Order thickener from pharmacy;Prohibited food (jello, ice cream, thin soups);Remove water pitcher;Have oral suction available   Follow up Recommendations Skilled Nursing facility      Frequency and Duration min 3x week  2 weeks       Prognosis Prognosis for Safe Diet Advancement: Fair Barriers to Reach Goals: Cognitive deficits;Severity of deficits      Swallow Study   General  Date of Onset: 08/09/17 HPI: Pt is a 76 y.o. male has a past medical history significant for CAD, current ETOH use, and PVD now s/p right CEA now with acute confusion and speech/swallowing issues.  Head CT shows posterior circulation infarcts which are new. The pt has been maintained on a heparin drip since his surgery 2 days ago. Currently he is lethargic and confused.; also tachycardic and hypertensive post op. 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 Had a previous endarterectomy several years ago. Also has a left carotid stenosis and 70% range. Pt remains confused w/ decreased alertness and attention.  Type of Study: Bedside Swallow Evaluation Previous Swallow Assessment: none indicated Diet Prior to this Study: Regular;Thin liquids (per pt report) Temperature Spikes Noted: No (wbc 9.8) Respiratory Status: Nasal cannula (2 liters) History of Recent Intubation: No Behavior/Cognition: Cooperative;Confused;Impulsive;Distractible;Requires cueing;Doesn't follow directions Oral Cavity Assessment: Dry (limited) Oral Care Completed by SLP: Recent completion by staff Oral Cavity - Dentition: Adequate natural dentition Vision:  (n/a) Self-Feeding Abilities: Total assist Patient Positioning: Upright in bed Baseline Vocal Quality: Low vocal intensity (mumbled speech) Volitional Cough: Cognitively unable to elicit Volitional Swallow: Unable to elicit    Oral/Motor/Sensory Function Overall Oral Motor/Sensory Function:  (unable to fully assess d/t cognitive status)   Ice Chips Ice chips: Impaired Presentation: Spoon (fed; 3 trials) Oral Phase Impairments: Reduced labial seal;Poor awareness of bolus Oral Phase Functional Implications: Prolonged oral transit;Oral holding Pharyngeal Phase Impairments: Throat Clearing - Delayed;Cough - Delayed Other Comments: consistent   Thin Liquid Thin Liquid: Not tested    Nectar Thick Nectar Thick Liquid: Impaired Presentation: Straw (fed; ~3 ozs total) Oral Phase Impairments: Reduced labial seal;Poor awareness of bolus Oral phase functional implications: Prolonged oral  transit Pharyngeal Phase Impairments:  (none) Other Comments: moderate cues required   Honey Thick Honey Thick Liquid: Not tested   Puree Puree: Impaired Presentation: Spoon (fed; 8 trials) Oral Phase Impairments: Reduced labial seal;Poor awareness of bolus Oral Phase Functional Implications: Prolonged oral transit Pharyngeal Phase Impairments:  (none) Other Comments: moderate cues required   Solid   GO   Solid: Not tested    Functional Assessment Tool Used: clinical judgement Functional Limitations: Swallowing Swallow Current Status (H5456): At least 40 percent but less than 60 percent impaired, limited or restricted Swallow Goal Status [phone]): At least 40 percent but less than 60 percent impaired, limited or restricted Swallow Discharge Status [phone]): At least 40 percent but less than 60 percent impaired, limited or restricted    Scott Kenner, MS, CCC-SLP Watson,Katherine 08/13/2017,1:28 PM

## 2017-08-13 NOTE — Progress Notes (Signed)
Telemetry monitor tech called to notify of pt having 40 beat run of Vtach, pt asymptomatic with one to one sitter at the bedside, Dr. Vianne Bulls callled and made aware new orders received for stat Mag and KCL labs, pt shows no s/s of distress or discomfort, sitter, [name] stated pt was coughing at the time of run of VTach, all safety measures in place, will continue to monitor. Harlene Ramus, RN

## 2017-08-13 NOTE — Evaluation (Signed)
Physical Therapy Evaluation Patient Details Name: Scott Larsen MRN: [id number] DOB: Mar 20, 1941 Today's Date: 08/13/2017   History of Present Illness  Pt is a 76 y.o. male s/p R carotid stent placement 08/09/17.  Pt demonstrating post-op confusion, agitation, tachycardic, vision impairments, difficulty following commands, and L sided weakness.  Imaging showing new multiple supra and infratentorial strokes.  PMH includes AAA, PAD, h/o TIA, recurrent R carotid artery stenosis, h/o ETOH abuse, htn, and MI.  Clinical Impression  Pt participated in therapy well in general but demonstrating inconsistency with following simple 1 step commands (which limited assessment) and pt required extra time to initiate movement or answer questions.  Impaired visual tracking noted during session (eyes tended to be in a "fixed" position) and flat affect noted.  L UE weakness noted and also L LE weakness.  Mild posterior lean in sitting.  L LE quad lag noted with L LE SLR but performed L heelslide AROM fairly well in bed.  C/o L thigh soreness during session.  L>R LE knees buckling when attempted to take a couple steps bed to chair so deferred further mobility for safety and pt assisted back to bed.  Oriented to person (name and DOB); did not know where he was; pt reported it was May 1969 and when told he was in hospital pt reported he was here d/t "poisoning".  With extra cueing pt able to count number of finger's therapist was holding up (pt initially reporting therapist showing him "8" but then when asked to count again pt asking if he needed to include doorframe and window frame in the count); pt then stating 1-2 fingers (therapist holding up 2 fingers the entire time).  Pt would benefit from skilled PT to address noted impairments and functional limitations (see below for any additional details).  Upon hospital discharge, recommend pt discharge to inpatient rehab.     Follow Up Recommendations CIR    Equipment  Recommendations   (TBD at next facility)    Recommendations for Other Services Rehab consult;OT consult;Speech consult     Precautions / Restrictions Precautions Precautions: Fall Precaution Comments: NPO Restrictions Weight Bearing Restrictions: No      Mobility  Bed Mobility Overal bed mobility: Needs Assistance Bed Mobility: Supine to Sit     Supine to sit: Mod assist;+2 for physical assistance;HOB elevated  Sit to supine: Mod assist;+2 for physical assistance;HOB elevated   General bed mobility comments: assist for trunk and B LE's supine to/from sit; vc's required for technique and requiring extra time and cueing to perform activities  Transfers Overall transfer level: Needs assistance Equipment used: None Transfers: Sit to/from Stand Sit to Stand: Mod assist         General transfer comment: assist to initiate stand and come to full upright posture; B knees blocked for safety  Ambulation/Gait Ambulation/Gait assistance: Max assist (2nd assist for IV pole management)   Assistive device: None       General Gait Details: attempted to take a couple steps to chair (B knees blocked) but B knees buckling and pt with difficulty maintaining upright position so pt assisted back into sitting safely on edge of bed  Stairs            Wheelchair Mobility    Modified Rankin (Stroke Patients Only)       Balance Overall balance assessment: Needs assistance Sitting-balance support: Bilateral upper extremity supported;Feet supported Sitting balance-Leahy Scale: Poor Sitting balance - Comments: intermittent min assist required d/t posterior lean Postural  control: Posterior lean   Standing balance-Leahy Scale: Poor Standing balance comment: requires support of therapist to maintain upright d/t mild posterior lean                             Pertinent Vitals/Pain Pain Assessment: Faces Faces Pain Scale: Hurts little more Pain Location: L  thigh Pain Descriptors / Indicators: Sore Pain Intervention(s): Limited activity within patient's tolerance;Monitored during session;Repositioned  Vitals (HR and O2 on 2 L via nasal cannula) stable and WFL throughout treatment session.     Home Living                   Additional Comments: Pt did not provide any home living information when asked (pt appearing confused).    Prior Function           Comments: Pt did not provide any PLOF details when asked except that he was ambulatory (pt appearing confused)     Hand Dominance        Extremity/Trunk Assessment   Upper Extremity Assessment Upper Extremity Assessment: Defer to OT evaluation;LUE deficits/detail;RUE deficits/detail;Difficult to assess due to impaired cognition RUE Deficits / Details: R shoulder flexion AROM at least 90 degrees AROM; good grip strength; difficulty following commands for elbow flexion/extension strength but at least 3+/5 LUE Deficits / Details: L shoulder flexion 1/5; L elbow flexion/extension at least 2/5 (pt with difficulty following commands to assess); grip strength decreased compared to L    Lower Extremity Assessment Lower Extremity Assessment: RLE deficits/detail;LLE deficits/detail;Difficult to assess due to impaired cognition RLE Deficits / Details: at least 3/5 AROM hip flexion, knee flexion/extension, and DF/PF AROM; able to perform SLR LLE Deficits / Details: at least 3/5 AROM hip flexion, knee flexion/extension, and DF/PF AROM; able to perform SLR but quad lag noted    Cervical / Trunk Assessment Cervical / Trunk Assessment: Normal  Communication   Communication:  (Difficult to understand pt at times; increased time to respond (pt responding in short phrases or a couple words at a time))  Cognition Arousal/Alertness:  (Pt initially sleeping and needing extra cueing to wake up) Behavior During Therapy: Flat affect Overall Cognitive Status: No family/caregiver present to  determine baseline cognitive functioning (Oriented to person (name and DOB); did not know where he was; reported it was May 1969 and when told he was in hospital pt reported he was here d/t "poisoning")                                 General Comments: Pt unable to track eyes any direction when cued (eyes tended to be in a fixed position; pt did turn head mildly L and R beginning of session when given significant cueing to look at therapist and nursing tech); pt able to verbalize therapist wearing "purple" and nursing tech wearing "blue/green" when cued; reported yellow socks were "khaki colored".  With extra cueing pt able to count number of finger's therapist was holding up (pt initially reporting therapist showing him "8" but then when asked to count again pt asking if he needed to include doorframe and window frame in the count); pt then stating 1-2 fingers (therapist holding up 2 fingers the entire time).      General Comments General comments (skin integrity, edema, etc.): Very mild L UE swelling noted (L UE elevated on pillows end of session).  Nursing cleared  pt for participation in physical therapy.  Pt agreeable to PT session.    Exercises  Bed mobility and transfer training.   Assessment/Plan    PT Assessment Patient needs continued PT services  PT Problem List Decreased strength;Decreased balance;Decreased mobility;Decreased coordination;Decreased cognition;Decreased knowledge of use of DME;Decreased safety awareness;Decreased knowledge of precautions;Pain       PT Treatment Interventions DME instruction;Gait training;Stair training;Functional mobility training;Therapeutic activities;Therapeutic exercise;Balance training;Neuromuscular re-education;Cognitive remediation;Patient/family education    PT Goals (Current goals can be found in the Care Plan section)  Acute Rehab PT Goals Patient Stated Goal: to be able to walk again PT Goal Formulation: Patient unable to  participate in goal setting Time For Goal Achievement: 08/27/17 Potential to Achieve Goals: Good    Frequency 7X/week   Barriers to discharge Decreased caregiver support      Co-evaluation               AM-PAC PT "6 Clicks" Daily Activity  Outcome Measure Difficulty turning over in bed (including adjusting bedclothes, sheets and blankets)?: Unable Difficulty moving from lying on back to sitting on the side of the bed? : Unable Difficulty sitting down on and standing up from a chair with arms (e.g., wheelchair, bedside commode, etc,.)?: Unable Help needed moving to and from a bed to chair (including a wheelchair)?: Total Help needed walking in hospital room?: Total Help needed climbing 3-5 steps with a railing? : Total 6 Click Score: 6    End of Session Equipment Utilized During Treatment: Gait belt;Oxygen (2 L O2 via nasal cannula) Activity Tolerance: Patient tolerated treatment well Patient left: in bed;with call bell/phone within reach;with bed alarm set;with nursing/sitter in room Nurse Communication: Mobility status;Precautions PT Visit Diagnosis: Other abnormalities of gait and mobility (R26.89);Muscle weakness (generalized) (M62.81);Unsteadiness on feet (R26.81);Hemiplegia and hemiparesis Hemiplegia - Right/Left: Left Hemiplegia - caused by: Cerebral infarction    Time: 3557-3220 PT Time Calculation (min) (ACUTE ONLY): 35 min   Charges:   PT Evaluation $PT Eval Low Complexity: 1 Low PT Treatments $Therapeutic Activity: 8-22 mins   PT G Codes:   PT G-Codes **NOT FOR INPATIENT CLASS** Functional Assessment Tool Used: AM-PAC 6 Clicks Basic Mobility Functional Limitation: Mobility: Walking and moving around Mobility: Walking and Moving Around Current Status (U5427): 100 percent impaired, limited or restricted Mobility: Walking and Moving Around Goal Status (C6237): 0 percent impaired, limited or restricted    Leitha Bleak, PT 08/13/17, 11:37  AM [phone]

## 2017-08-13 NOTE — Progress Notes (Signed)
[facility]  Daily Progress Note   Subjective  - 4 Days Post-Op  Still somnolent, but does follow commands.  Strength in left arm slightly improved, 3-4/5.  Squeezes and moves feet to commands  Objective Vitals:   08/13/17 0652 08/13/17 0739 08/13/17 1242 08/13/17 2003  BP:  (!) 149/70 (!) 152/74 95/66  Pulse:  81 72 83  Resp:  18 (!) 28 18  Temp:  98.2 F (36.8 C) 98.7 F (37.1 C) 97.7 F (36.5 C)  TempSrc:  Oral Oral Oral  SpO2: 95% 97% 99% 94%  Weight:      Height:        Intake/Output Summary (Last 24 hours) at 08/13/17 2032 Last data filed at 08/13/17 0600  Gross per 24 hour  Intake               50 ml  Output                0 ml  Net               50 ml    PULM  Rhonchi present CV  RRR VASC  Access site C/D/I with stable bruising  Laboratory CBC    Component Value Date/Time   WBC 9.8 08/13/2017 0623   HGB 10.6 (L) 08/13/2017 0623   HCT 30.5 (L) 08/13/2017 0623   PLT 169 08/13/2017 0623    BMET    Component Value Date/Time   NA 140 08/11/2017 0545   K 4.7 08/13/2017 1725   CL 112 (H) 08/11/2017 0545   CO2 23 08/11/2017 0545   GLUCOSE 85 08/11/2017 0545   BUN 15 08/11/2017 0545   CREATININE 1.45 (H) 08/11/2017 0545   CREATININE 1.74 (H) 02/21/2012 0926   CALCIUM 7.4 (L) 08/11/2017 0545   GFRNONAA 46 (L) 08/11/2017 0545   GFRNONAA 42 (L) 02/21/2012 0926   GFRAA 53 (L) 08/11/2017 0545   GFRAA 50 (L) 02/21/2012 0926    Assessment/Planning: POD #4 s/p right carotid stent placement   Stroke mostly posterior circulation on studies  Slightly improved today  PT has started.  Will need PT/OT/swallowing eval  Likely will need placement at discharge, potentially later this week  Continue heparin drip for now  Appreciate IM and Neuro input    Leotis Pain  08/13/2017, 8:32 PM

## 2017-08-13 NOTE — Progress Notes (Signed)
ANTICOAGULATION CONSULT NOTE - Initial Consult  Pharmacy Consult for Heparin Drip  Indication: stroke  No Known Allergies  Patient Measurements: Height: 5\' 10"  (177.8 cm) Weight: 154 lb 1.6 oz (69.9 kg) IBW/kg (Calculated) : 73  Vital Signs: Temp: 98.7 F (37.1 C) (09/17 1242) Temp Source: Oral (09/17 1242) BP: 152/74 (09/17 1242) Pulse Rate: 72 (09/17 1242)  Labs:  Recent Labs  08/11/17 0545  08/12/17 0506  08/12/17 2223 08/13/17 0623 08/13/17 1416  HGB 10.5*  --  10.6*  --   --  10.6*  --   HCT 30.3*  --  30.6*  --   --  30.5*  --   PLT 177  --  168  --   --  169  --   HEPARINUNFRC 0.23*  < > 0.33  < > 0.30 0.33 0.25*  CREATININE 1.45*  --   --   --   --   --   --   < > = values in this interval not displayed.  Estimated Creatinine Clearance: 44.2 mL/min (A) (by C-G formula based on SCr of 1.45 mg/dL (H)).   Medical History: Past Medical History:  Diagnosis Date  . Cancer ([place])   . Coronary artery disease   . History of kidney stones    40 years ago  . Hypertension   . Myocardial infarction [facility])     Assessment: 76 yo male with possible  ischemic stroke. Pharmacy consulted for heparin dosing and monitoring. No bolus per consult.  Goal of Therapy:  Heparin level 0.3-0.5 units/ml Monitor platelets by anticoagulation protocol: Yes   Plan:  HL = 0.25 is slightly subtherapeutic on heparin infusion rate of 1100 units/hr. Spoke with RN - heparin drip was interrupted for about an hour this afternoon for MRI. Will continue current rate given interruption in infusion and recheck HL in 6 hours.  Lenis Noon, Pharm.D., BCPS Clinical Pharmacist 08/13/2017 4:25 PM

## 2017-08-13 NOTE — Plan of Care (Signed)
Problem: SLP Dysphagia Goals Goal: Misc Dysphagia Goal Pt will safely tolerate po diet of least restrictive consistency w/ no overt s/s of aspiration noted by Staff/pt/family x3 sessions.    

## 2017-08-13 NOTE — Progress Notes (Signed)
Patient seen for abg. Currently on 2liters with saturation of  95%.  BBS are coarse with upper airway wheezes post svn given by RN.  Patient with increased respirations post coughing espisodes. Settles to a shallow breathing pattern once he calms down. ABG obtained. See results.

## 2017-08-13 NOTE — Progress Notes (Addendum)
Patient is alert and able to tell his name and DOB. No acute distress noted. No CIWA withdrawal symptoms with the exception of his tremors felt when he's touched. Safety sitter at bedside the whole of this shift. No s/s of pain or any acute distress noted. Heparin drip now running at 11 mL/hr, no bleeding hematoma or any new bruises noted. Patient received a bath this morning. Weaned 02 from 3L to 2L/[place] and patient  sating  at 95%. Will continue to monitor.

## 2017-08-13 NOTE — Progress Notes (Signed)
Inpatient Rehabilitation  Per PT request, patient was screened by Gunnar Fusi for appropriateness for an Inpatient Acute Rehab consult.  At this time we will plan to follow for OT evlaution recommendations.  Please call if questions.    Carmelia Roller., CCC/SLP Admission Coordinator  [facility]  Cell [phone]

## 2017-08-13 NOTE — Progress Notes (Signed)
Talked to Dr. Ara Kussmaul about patient's respiratory status changes, patient has audible wheeziness, crackles at lung bases and tachypneic., gave PRN Albuterol treatment. Patient is also lethargic, which was a change, per MD's note they are aware of him being somnolent, order place for stat ABG, CXR and IV lasix.  Asked if patient need to be transferred and per MD, she will assess the patient as soon as she can. No other concern at the moment. RN will continue to monitor.

## 2017-08-14 LAB — CBC
HCT: 29.6 % — ABNORMAL LOW (ref 40.0–52.0)
Hemoglobin: 10.4 g/dL — ABNORMAL LOW (ref 13.0–18.0)
MCH: 37 pg — AB (ref 26.0–34.0)
MCHC: 35.1 g/dL (ref 32.0–36.0)
MCV: 105.4 fL — ABNORMAL HIGH (ref 80.0–100.0)
PLATELETS: 213 10*3/uL (ref 150–440)
RBC: 2.81 MIL/uL — ABNORMAL LOW (ref 4.40–5.90)
RDW: 13.5 % (ref 11.5–14.5)
WBC: 9 10*3/uL (ref 3.8–10.6)

## 2017-08-14 LAB — HEPARIN LEVEL (UNFRACTIONATED)
HEPARIN UNFRACTIONATED: 0.5 [IU]/mL (ref 0.30–0.70)
Heparin Unfractionated: 0.43 IU/mL (ref 0.30–0.70)

## 2017-08-14 MED ORDER — SODIUM BICARBONATE 8.4 % IV SOLN
50.0000 meq | Freq: Once | INTRAVENOUS | Status: AC
  Administered 2017-08-14: 50 meq via INTRAVENOUS
  Filled 2017-08-14: qty 50

## 2017-08-14 MED ORDER — SODIUM CHLORIDE 0.9% FLUSH
3.0000 mL | INTRAVENOUS | Status: DC | PRN
  Administered 2017-08-14: 3 mL via INTRAVENOUS
  Filled 2017-08-14: qty 3

## 2017-08-14 MED ORDER — METOPROLOL TARTRATE 5 MG/5ML IV SOLN: 5.0000 mg | Freq: Four times a day (QID) | INTRAVENOUS | Status: DC | PRN

## 2017-08-14 MED ORDER — HEPARIN (PORCINE) IN NACL 100-0.45 UNIT/ML-% IJ SOLN
1000.0000 [IU]/h | INTRAMUSCULAR | Status: DC
  Administered 2017-08-14 (×2): 1200 [IU]/h via INTRAVENOUS
  Filled 2017-08-14 (×3): qty 250

## 2017-08-14 MED ORDER — FUROSEMIDE 10 MG/ML IJ SOLN
20.0000 mg | Freq: Once | INTRAMUSCULAR | Status: AC
  Administered 2017-08-14: 20 mg via INTRAVENOUS
  Filled 2017-08-14: qty 2

## 2017-08-14 NOTE — Progress Notes (Signed)
Physical Therapy Treatment Patient Details Name: Scott Larsen MRN: [id number] DOB: August 11, 1941 Today's Date: 08/14/2017    History of Present Illness Pt is a 76 y.o. male s/p R carotid stent placement 08/09/17.  Pt demonstrating post-op confusion, agitation, tachycardic, vision impairments, difficulty following commands, and L sided weakness.  Imaging showing new multiple supra and infratentorial strokes.  PMH includes AAA, PAD, h/o TIA, recurrent R carotid artery stenosis, h/o ETOH abuse, htn, and MI.    PT Comments    Pt initially requiring increased time to follow one step commands but this improved some with activity.  Pt able to progress to standing with RW and taking a few steps bed to recliner with 2 assist (no knee buckling noted today although LE weakness/shakiness noted with some LE ex's and activities).  R UE tremor noted in sitting.  Pt appearing confused in general during session with his statements but did well participating in physical therapy session.  Will continue to focus on strengthening, balance, and progressive functional mobility per pt tolerance.   Follow Up Recommendations  CIR     Equipment Recommendations  Rolling walker with 5" wheels    Recommendations for Other Services Rehab consult;OT consult;Speech consult     Precautions / Restrictions Precautions Precautions: Fall Restrictions Weight Bearing Restrictions: No    Mobility  Bed Mobility Overal bed mobility: Needs Assistance Bed Mobility: Supine to Sit     Supine to sit: Mod assist;Max assist     General bed mobility comments: assist for L LE and trunk; vc's for hand placement and technique; increased time to perform; assist to scoot forward onto edge of bed  Transfers Overall transfer level: Needs assistance Equipment used: Rolling walker (2 wheeled) Transfers: Sit to/from Stand Sit to Stand: Min guard;Min assist;Mod assist;+2 physical assistance         General transfer comment: x2  trials; min to mod assist x1 plus CGA of 2nd for safety; vc's and tactile cues for LE and UE placement and to lean forward to stand; assist for hand placement and technique to sit (especially when attempting to sit in recliner chair requiring increased time to perform)  Ambulation/Gait Ambulation/Gait assistance: Min guard;Min assist;Mod assist Ambulation Distance (Feet): 3 Feet (bed to recliner) Assistive device: Rolling walker (2 wheeled)       General Gait Details: vc's for technique and for steps   Stairs            Wheelchair Mobility    Modified Rankin (Stroke Patients Only)       Balance Overall balance assessment: Needs assistance Sitting-balance support: No upper extremity supported;Feet supported Sitting balance-Leahy Scale: Fair Sitting balance - Comments: static sitting Postural control: Posterior lean Standing balance support: Bilateral upper extremity supported Standing balance-Leahy Scale: Poor Standing balance comment: initial posterior lean standing but able to correct with vc's for upright posture and to shift weight forward                            Cognition Arousal/Alertness: Awake/alert Behavior During Therapy: Anxious;Flat affect Overall Cognitive Status: No family/caregiver present to determine baseline cognitive functioning (Oriented to name and DOB.)                                 General Comments: Pt reports he was in a "cabin" (also asked during session if he was in a prison or mental facility);  recalled 2018 but not month; did not remember recent surgery      Exercises General Exercises - Lower Extremity Ankle Circles/Pumps: AROM;Strengthening;Both;10 reps;Supine Short Arc Quad: AROM;Strengthening;Both;10 reps;Supine Heel Slides: AROM;Strengthening;Both;10 reps;Supine Hip ABduction/ADduction: AAROM;Strengthening;Both;10 reps;Supine Straight Leg Raises: AROM;Strengthening;Both;10 reps;Supine  Vc's, tactile  cues, and visual cues required for above ex's with increased time to perform or to understand technique at times.    General Comments   Nursing cleared pt for participation in physical therapy.  Pt agreeable to PT session.      Pertinent Vitals/Pain Pain Assessment: Faces Faces Pain Scale: No hurt Pain Intervention(s): Limited activity within patient's tolerance;Monitored during session  Vitals (HR and O2 on 2 L via nasal cannula) stable and WFL throughout treatment session.    Home Living                      Prior Function            PT Goals (current goals can now be found in the care plan section) Acute Rehab PT Goals Patient Stated Goal: get better PT Goal Formulation: With patient Time For Goal Achievement: 08/27/17 Potential to Achieve Goals: Good Progress towards PT goals: Progressing toward goals    Frequency    7X/week      PT Plan Current plan remains appropriate    Co-evaluation              AM-PAC PT "6 Clicks" Daily Activity  Outcome Measure  Difficulty turning over in bed (including adjusting bedclothes, sheets and blankets)?: Unable Difficulty moving from lying on back to sitting on the side of the bed? : Unable Difficulty sitting down on and standing up from a chair with arms (e.g., wheelchair, bedside commode, etc,.)?: Unable Help needed moving to and from a bed to chair (including a wheelchair)?: Total Help needed walking in hospital room?: Total Help needed climbing 3-5 steps with a railing? : Total 6 Click Score: 6    End of Session Equipment Utilized During Treatment: Gait belt;Oxygen (2 L O2 via nasal cannula) Activity Tolerance: Patient tolerated treatment well Patient left: in chair;with call bell/phone within reach;with chair alarm set Nurse Communication: Mobility status;Precautions PT Visit Diagnosis: Other abnormalities of gait and mobility (R26.89);Muscle weakness (generalized) (M62.81);Unsteadiness on feet  (R26.81);Hemiplegia and hemiparesis Hemiplegia - Right/Left: Left Hemiplegia - caused by: Cerebral infarction     Time: 5726-2035 PT Time Calculation (min) (ACUTE ONLY): 51 min  Charges:  $Therapeutic Exercise: 8-22 mins $Therapeutic Activity: 23-37 mins                    G CodesLeitha Bleak, PT 08/14/17, 4:05 PM [phone]

## 2017-08-14 NOTE — Progress Notes (Signed)
[facility]  Daily Progress Note   Subjective  - 5 Days Post-Op  Able to talk and create sentences today Follows commands and strength seems 5/5 this am in all four extremities. Still slow and confused, but better today Respiratory issues overnight.  Needs IS use and encouraging deep breathing and coughing  Objective Vitals:   08/13/17 2003 08/13/17 2216 08/14/17 0010 08/14/17 0403  BP: 95/66 107/74 (!) 172/90 103/74  Pulse: 83 90 88 85  Resp: 18 (!) 28 (!) 36 (!) 22  Temp: 97.7 F (36.5 C)  (!) 97.5 F (36.4 C) 98.2 F (36.8 C)  TempSrc: Oral  Oral Oral  SpO2: 94% 94% 97% 97%  Weight:      Height:        Intake/Output Summary (Last 24 hours) at 08/14/17 0827 Last data filed at 08/14/17 0300  Gross per 24 hour  Intake           257.34 ml  Output                0 ml  Net           257.34 ml    PULM  Rhonchi present CV  RRR VASC  Access site bruising stable  Laboratory CBC    Component Value Date/Time   WBC 9.8 08/13/2017 0623   HGB 10.6 (L) 08/13/2017 0623   HCT 30.5 (L) 08/13/2017 0623   PLT 169 08/13/2017 0623    BMET    Component Value Date/Time   NA 140 08/11/2017 0545   K 4.7 08/13/2017 1725   CL 112 (H) 08/11/2017 0545   CO2 23 08/11/2017 0545   GLUCOSE 85 08/11/2017 0545   BUN 15 08/11/2017 0545   CREATININE 1.45 (H) 08/11/2017 0545   CREATININE 1.74 (H) 02/21/2012 0926   CALCIUM 7.4 (L) 08/11/2017 0545   GFRNONAA 46 (L) 08/11/2017 0545   GFRNONAA 42 (L) 02/21/2012 0926   GFRAA 53 (L) 08/11/2017 0545   GFRAA 50 (L) 02/21/2012 0926    Assessment/Planning: POD #5 s/p right carotid stent placement   Stroke, posterior circulation  Seems to be gradually improving  PT/OT/Speech to start improving  Will need placement at rehab on discharge    Leotis Pain  08/14/2017, 8:27 AM

## 2017-08-14 NOTE — Evaluation (Signed)
Occupational Therapy Evaluation Patient Details Name: Scott Larsen MRN: [id number] DOB: 16-Jan-1941 Today's Date: 08/14/2017    History of Present Illness Pt is a 76 y.o. male s/p R carotid stent placement 08/09/17.  Pt demonstrating post-op confusion, agitation, tachycardic, vision impairments, difficulty following commands, and L sided weakness.  Imaging showing new multiple supra and infratentorial strokes.  PMH includes AAA, PAD, h/o TIA, recurrent R carotid artery stenosis, h/o ETOH abuse, htn, and MI.   Clinical Impression   Pt is 76 year old male who presents with impaired cognition, oriented to name and DOB, impaired vision (unable to fully assess due to cognition, will continue to assess), decreased coordination with gross and fine motor skills/strength/ROM of LUE. Increased time and tactile/verbal cues to follow simple commands, inconsistent responses to questioning (color of wrist band, number of fingers), impaired cardiopulmonary status (required 2.5L O2 for bed mobility, dropped to 90% on room air while conversing with OT at bed level after approx 3 minutes) and impaired activity tolerance. Pt required max assist for trunk and BLE mgt for supine<>sit EOB, tolerated sitting EOB with no LOB and intermittent min guard for approx 20 minutes while on 2.5L O2 and O2 sats >97% throughout bed mobility. Pt required mod assist to bring cup to mouth from SLP who briefly assessed swallowing while OT worked with pt sitting EOB. Max assist for LB ADL tasks from bed level. Pt would benefit from skilled OT services to address ADL training, fine motor skills training, adaptive equipment training, strengthening, neuro re-education, cognitive skills, and family ed and training.  Pt would benefit from acute in-patient rehab for continued rehab after discharge from hospital.    Follow Up Recommendations  CIR    Equipment Recommendations  Other (comment) (TBD)    Recommendations for Other Services Rehab  consult     Precautions / Restrictions Precautions Precautions: Fall Restrictions Weight Bearing Restrictions: No      Mobility Bed Mobility Overal bed mobility: Needs Assistance Bed Mobility: Supine to Sit;Sit to Supine     Supine to sit: Max assist;HOB elevated;+2 for safety/equipment Sit to supine: Max assist;+2 for safety/equipment   General bed mobility comments: max assist for trunk and BLE supine<>sit EOB, +2 for managing O2 tubing  Transfers Overall transfer level: Needs assistance   Transfers: Lateral/Scoot Transfers          Lateral/Scoot Transfers: Mod assist;+2 physical assistance General transfer comment: verbal cues to initiate lateral/scoot transfer with B knees blocked for safety    Balance Overall balance assessment: Needs assistance Sitting-balance support: Feet supported;Feet unsupported;Single extremity supported Sitting balance-Leahy Scale: Fair Sitting balance - Comments: fair + with intermittent min guard, no posterior lean, able to tolerate sitting EOB for approx 20 minutes with no LOB, feet crossed at times                                   ADL either performed or assessed with clinical judgement   ADL Overall ADL's : Needs assistance/impaired Eating/Feeding: Maximal assistance Eating/Feeding Details (indicate cue type and reason): NPO at start of session, SLP assess swallowing while pt sat EOB, upgraded to modified diet (see SLP note), max assist to self feed in order to conserve energy expenditure for sitting balance and activity tolerance, mild tremor noted when attempting to bring cup to mouth with mod assist from SLP to hold. Please see SLP note for additional detail regarding oral motor/swallowing/dietary precautions Grooming:  Sitting;Set up;Minimal assistance;Moderate assistance;Cueing for sequencing Grooming Details (indicate cue type and reason): additional time/effort to perform with VC to initiate and complete task Upper  Body Bathing: Moderate assistance;Bed level   Lower Body Bathing: Maximal assistance;Bed level   Upper Body Dressing : Moderate assistance;Sitting;Maximal assistance   Lower Body Dressing: Maximal assistance;Bed level     Toilet Transfer Details (indicate cue type and reason): unsafe to attempt this date           General ADL Comments: pt requires additional time and cues to perform tasks      Vision Baseline Vision/History:  (unsure, pt slightly confused, no family present ) Patient Visual Report: Other (comment) (unsure, pt slightly confused, no family present) Vision Assessment?: Vision impaired- to be further tested in functional context Additional Comments: difficult to assess due to impaired cognition, pt inconsistent with responses to colors when asked, able to turn eye and visually track somewhat with verbal cues, able to identify number of fingers within 2 feet in front of him.      Perception     Praxis      Pertinent Vitals/Pain Pain Assessment: Faces Faces Pain Scale: Hurts little more Pain Location: R thigh "cramps a little" Pain Descriptors / Indicators: Cramping Pain Intervention(s): Limited activity within patient's tolerance;Monitored during session;Repositioned     Hand Dominance Right   Extremity/Trunk Assessment Upper Extremity Assessment Upper Extremity Assessment: Difficult to assess due to impaired cognition;RUE deficits/detail;LUE deficits/detail RUE Deficits / Details: RUE grossly 4-/5, good grip strength, increased time/cues to follow commands; impaired finger to nose testing (impaired cognition vs. coordination vs. motor planning vs visual perceptual) RUE Coordination: decreased fine motor LUE Deficits / Details: L shoulder flexion 3-/5; L elbow flexion/extension at least 2+/5 (pt with difficulty following commands to assess); grip strength decreased compared to L   Lower Extremity Assessment Lower Extremity Assessment: Defer to PT  evaluation;RLE deficits/detail;LLE deficits/detail;Difficult to assess due to impaired cognition RLE Deficits / Details: at least 3/5 AROM hip flexion, knee flexion/extension, and DF/PF AROM; able to perform SLR LLE Deficits / Details: at least 3/5 AROM hip flexion, knee flexion/extension, and DF/PF AROM; able to perform SLR but quad lag noted   Cervical / Trunk Assessment Cervical / Trunk Assessment: Normal   Communication Communication Communication: Other (comment) (difficult to assess- increased time, cues, slightly mumbled at times)   Cognition Arousal/Alertness: Awake/alert (initially a little lethargic but with time and cueing and repositioning of HOB, pt more alert) Behavior During Therapy: WFL for tasks assessed/performed Overall Cognitive Status: No family/caregiver present to determine baseline cognitive functioning                                 General Comments: Alert and oriented to name and DOB; reports "sitting on his couch at home" (lying in hospital bed), reports seeing a green man on the TV and points to left side of the room (tv on right side of room turned off). Difficulty naming colors (yellow arm band was "turquoise"). Follows 1 step instructions with additional time and cues. Decreased insight into deficits.   General Comments  mild LUE swelling noted, elevated on pillow at end of session.    Exercises     Shoulder Instructions      Home Living Family/patient expects to be discharged to:: Private residence Living Arrangements: Spouse/significant other  Additional Comments: Pt unable to provide additional PLOF information. Per chart review, pt lives with spouse.      Prior Functioning/Environment          Comments: Pt unable to provide additional PLOF information. Per chart review, pt lives with spouse.        OT Problem List: Decreased strength;Decreased coordination;Cardiopulmonary status  limiting activity;Decreased cognition;Decreased range of motion;Decreased activity tolerance;Decreased safety awareness;Impaired balance (sitting and/or standing);Impaired vision/perception;Impaired UE functional use      OT Treatment/Interventions: Self-care/ADL training;Therapeutic exercise;Therapeutic activities;Neuromuscular education;Cognitive remediation/compensation;Visual/perceptual remediation/compensation;DME and/or AE instruction;Patient/family education;Balance training    OT Goals(Current goals can be found in the care plan section) Acute Rehab OT Goals Patient Stated Goal: get better OT Goal Formulation: With patient Time For Goal Achievement: 08/28/17 Potential to Achieve Goals: Good  OT Frequency: Min 3X/week   Barriers to D/C:    unsure of PLOF/home set up/assist, no family present        Co-evaluation              AM-PAC PT "6 Clicks" Daily Activity     Outcome Measure Help from another person eating meals?: A Lot Help from another person taking care of personal grooming?: A Lot Help from another person toileting, which includes using toliet, bedpan, or urinal?: A Lot Help from another person bathing (including washing, rinsing, drying)?: A Lot Help from another person to put on and taking off regular upper body clothing?: A Lot Help from another person to put on and taking off regular lower body clothing?: A Lot 6 Click Score: 12   End of Session Equipment Utilized During Treatment: Oxygen  Activity Tolerance: Patient tolerated treatment well Patient left: in bed;with call bell/phone within reach;with bed alarm set  OT Visit Diagnosis: Other abnormalities of gait and mobility (R26.89);Other symptoms and signs involving cognitive function;Hemiplegia and hemiparesis Hemiplegia - Right/Left: Left Hemiplegia - dominant/non-dominant: Non-Dominant Hemiplegia - caused by: Cerebral infarction                Time: 1002-1050 OT Time Calculation (min): 48  min Charges:  OT General Charges $OT Visit: 1 Visit OT Evaluation $OT Eval Moderate Complexity: 1 Mod OT Treatments $Self Care/Home Management : 8-22 mins G-Codes: OT G-codes **NOT FOR INPATIENT CLASS** Functional Assessment Tool Used: AM-PAC 6 Clicks Daily Activity;Clinical judgement Functional Limitation: Self care Self Care Current Status (G8115): At least 60 percent but less than 80 percent impaired, limited or restricted Self Care Goal Status (B2620): At least 40 percent but less than 60 percent impaired, limited or restricted   Jeni Salles, MPH, MS, OTR/L ascom [phone] 08/14/17, 11:56 AM

## 2017-08-14 NOTE — Care Management (Signed)
Barrier- dyspnea during the night and chest xray revealed increased interstitial edema. IV Lasix.  PT and OT recommending CIR.  Patient very slow to verbally respond to CM when attempted to discuss "how he is doing."  CM has reached out to  patient's emergency contact- Brand Males and left voicemail on home and work phone to discuss caregiver support that would be available post discharge.  It appears that prior to this episode of illness, patient was independent. Patient to be started on Pureed 1 with honey thick.

## 2017-08-14 NOTE — Progress Notes (Signed)
ANTICOAGULATION CONSULT NOTE - Initial Consult  Pharmacy Consult for Heparin Drip  Indication: stroke  No Known Allergies  Patient Measurements: Height: 5\' 10"  (177.8 cm) Weight: 154 lb 1.6 oz (69.9 kg) IBW/kg (Calculated) : 73  Vital Signs: Temp: 97.5 F (36.4 C) (09/18 0933) Temp Source: Oral (09/18 0933) BP: 170/76 (09/18 0933) Pulse Rate: 91 (09/18 0933)  Labs:  Recent Labs  08/12/17 0506  08/13/17 0623 08/13/17 1416 08/13/17 2202 08/14/17 0911  HGB 10.6*  --  10.6*  --   --  10.4*  HCT 30.6*  --  30.5*  --   --  29.6*  PLT 168  --  169  --   --  213  HEPARINUNFRC 0.33  < > 0.33 0.25* 0.26* 0.43  < > = values in this interval not displayed.  Estimated Creatinine Clearance: 44.2 mL/min (A) (by C-G formula based on SCr of 1.45 mg/dL (H)).   Medical History: Past Medical History:  Diagnosis Date  . Cancer ([place])   . Coronary artery disease   . History of kidney stones    40 years ago  . Hypertension   . Myocardial infarction [facility])     Assessment: 76 yo male with possible  ischemic stroke. Pharmacy consulted for heparin dosing and monitoring. No bolus per consult.  Goal of Therapy:  Heparin level 0.3-0.5 units/ml Monitor platelets by anticoagulation protocol: Yes   Plan:  HL = 0.25 is slightly subtherapeutic on heparin infusion rate of 1100 units/hr. Spoke with RN - heparin drip was interrupted for about an hour this afternoon for MRI. Will continue current rate given interruption in infusion and recheck HL in 6 hours.  0917 2200 heparin level 0.26. Increase rate to 1250 units/hr. No bolus per consult order. Recheck in 8 hours.  9/15 0911 HL therapeutic x 1. Continue current rate. Will recheck HL in 8 hours.  Laural Benes, Pharm.D., BCPS Clinical Pharmacist 08/14/2017 10:03 AM

## 2017-08-14 NOTE — Progress Notes (Signed)
ANTICOAGULATION CONSULT NOTE - Initial Consult  Pharmacy Consult for Heparin Drip  Indication: stroke  No Known Allergies  Patient Measurements: Height: 5\' 10"  (177.8 cm) Weight: 154 lb 1.6 oz (69.9 kg) IBW/kg (Calculated) : 73  Vital Signs: Temp: 97.4 F (36.3 C) (09/18 1207) Temp Source: Oral (09/18 1207) BP: 150/72 (09/18 1207) Pulse Rate: 74 (09/18 1207)  Labs:  Recent Labs  08/12/17 0506  08/13/17 9390  08/13/17 2202 08/14/17 0911 08/14/17 1759  HGB 10.6*  --  10.6*  --   --  10.4*  --   HCT 30.6*  --  30.5*  --   --  29.6*  --   PLT 168  --  169  --   --  213  --   HEPARINUNFRC 0.33  < > 0.33  < > 0.26* 0.43 0.50  < > = values in this interval not displayed.  Estimated Creatinine Clearance: 44.2 mL/min (A) (by C-G formula based on SCr of 1.45 mg/dL (H)).   Medical History: Past Medical History:  Diagnosis Date  . Cancer ([place])   . Coronary artery disease   . History of kidney stones    40 years ago  . Hypertension   . Myocardial infarction [facility])     Assessment: 76 yo male with possible  ischemic stroke. Pharmacy consulted for heparin dosing and monitoring. No bolus per consult.  Goal of Therapy:  Heparin level 0.3-0.5 units/ml Monitor platelets by anticoagulation protocol: Yes   Plan:  HL = 0.50 which is on the high end of therapeutic range. Will slightly decrease heparin drip to 1200 units/hr and recheck HL in 6 hours. CBC ordered with AM labs tomorrow.  Lenis Noon, Pharm.D., BCPS Clinical Pharmacist 08/14/2017 6:49 PM

## 2017-08-14 NOTE — Progress Notes (Signed)
  Speech Language Pathology Treatment: Dysphagia  Patient Details Name: Scott Larsen MRN: [id number] DOB: August 29, 1941 Today's Date: 08/14/2017 Time: 1000-1045 SLP Time Calculation (min) (ACUTE ONLY): 45 min  Assessment / Plan / Recommendation Clinical Impression  Pt's po's were placed on hold d/t concern for aspiration during the evening last night; lasix was also initiated per chart note. Pt was seen at end of OT session while sitting EOB - pt appeared more alert and verbally engaged today.  Pt appears to present w/ oropharyngeal phase dysphagia impacted by declined Cognitive and medical status' overall. Pt required min-moderate verbal/tactile cues consistently throughout the session to attend to task of oral intake. Pt consumed po trials of Honey consistency liquids via tsp/cup w/ no overt s/s of aspiration noted; no decline in respiratory status or vocal quality during/post trials. Similar noted w/ trials of puree. Pt exhibited fairly adequate oral phase for bolus management and clearing w/ all trial consistencies; no oral residue noted. Of note, due to pt's overall declined medical and cognitive status' w/ recent dx of acute infarcts including R posterior frontal lobe, no aggressive po trials were attempted this session. ST services will monitor pt's toleration of this diet consistency w/ potential f/u w/ MBSS next 1-2 days. Pt will also benefit from f/u Cognive-linguistic assessment d/t determine cognitive-communication abilities and needs for f/u skilled therapy. MD/NSG updated.     HPI HPI: Pt is a 76 y.o. male has a past medical history significant for CAD, current ETOH use, and PVD now s/p right CEA now with acute confusion and speech/swallowing issues. Head CT shows posterior circulation infarcts which are new. The pt has been maintained on a heparin drip since his surgery 2 days ago. Currently he is lethargic and confused.; also tachycardic and hypertensive post op. 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 Had a previous endarterectomy several years ago. Also has a left carotid stenosis and 70% range. Pt remains confused but min more alert and attentive today.      SLP Plan  Continue with current plan of care       Recommendations  Diet recommendations: Dysphagia 1 (puree);Honey-thick liquid Liquids provided via: Cup;Teaspoon;No straw Medication Administration: Crushed with puree Supervision: Staff to assist with self feeding;Full supervision/cueing for compensatory strategies Compensations: Minimize environmental distractions;Slow rate;Small sips/bites;Lingual sweep for clearance of pocketing;Follow solids with liquid Postural Changes and/or Swallow Maneuvers: Seated upright 90 degrees;Upright 30-60 min after meal                Oral Care Recommendations: Oral care BID;Staff/trained caregiver to provide oral care;Patient independent with oral care Follow up Recommendations: Inpatient Rehab (consider ) SLP Visit Diagnosis: Dysphagia, oropharyngeal phase (R13.12) Plan: Continue with current plan of care       [place], [place], CCC-SLP Clinton Dragone 08/14/2017, 3:16 PM

## 2017-08-14 NOTE — Progress Notes (Signed)
Inpatient Rehabilitation  Note that PT and OT are recommending IP Rehab for post acute therapies and case manager is in the process of determining care giver support.  Plan to follow up tomorrow.  Please call if questions.    Carmelia Roller., CCC/SLP Admission Coordinator  [place]  Cell [phone]

## 2017-08-14 NOTE — Care Management (Signed)
Spoke with Patient's significant other Scott Larsen. Confirmed that prior to this illness, patient was active and independent.  She works full time and there are no other caregivers available.  Patient has a brother and "they are close" but they do not talk much.  The amount of support available after a potential rehab stay would most likely be determined by "how much help patient would need" - which CM relayed could not be determined at present. CM approached patient to ask permission to call his brother but patient drowsy and does not say yes or no.  Will try again tomorrow.

## 2017-08-14 NOTE — Progress Notes (Signed)
ANTICOAGULATION CONSULT NOTE - Initial Consult  Pharmacy Consult for Heparin Drip  Indication: stroke  No Known Allergies  Patient Measurements: Height: 5\' 10"  (177.8 cm) Weight: 154 lb 1.6 oz (69.9 kg) IBW/kg (Calculated) : 73  Vital Signs: Temp: 97.5 F (36.4 C) (09/18 0010) Temp Source: Oral (09/18 0010) BP: 172/90 (09/18 0010) Pulse Rate: 88 (09/18 0010)  Labs:  Recent Labs  08/11/17 0545  08/12/17 0506  08/13/17 0623 08/13/17 1416 08/13/17 2202  HGB 10.5*  --  10.6*  --  10.6*  --   --   HCT 30.3*  --  30.6*  --  30.5*  --   --   PLT 177  --  168  --  169  --   --   HEPARINUNFRC 0.23*  < > 0.33  < > 0.33 0.25* 0.26*  CREATININE 1.45*  --   --   --   --   --   --   < > = values in this interval not displayed.  Estimated Creatinine Clearance: 44.2 mL/min (A) (by C-G formula based on SCr of 1.45 mg/dL (H)).   Medical History: Past Medical History:  Diagnosis Date  . Cancer ([place])   . Coronary artery disease   . History of kidney stones    40 years ago  . Hypertension   . Myocardial infarction [facility])     Assessment: 76 yo male with possible  ischemic stroke. Pharmacy consulted for heparin dosing and monitoring. No bolus per consult.  Goal of Therapy:  Heparin level 0.3-0.5 units/ml Monitor platelets by anticoagulation protocol: Yes   Plan:  HL = 0.25 is slightly subtherapeutic on heparin infusion rate of 1100 units/hr. Spoke with RN - heparin drip was interrupted for about an hour this afternoon for MRI. Will continue current rate given interruption in infusion and recheck HL in 6 hours.  0917 2200 heparin level 0.26. Increase rate to 1250 units/hr. No bolus per consult order. Recheck in 8 hours.  Juvia Aerts S, Pharm.D., BCPS Clinical Pharmacist 08/14/2017 12:25 AM

## 2017-08-14 NOTE — Progress Notes (Signed)
Was called by RN for shortness of breath. Patient tachypneic.  Patient seen and evaluated, auscultation of chest revealed bibasilar creptations. ABG reviewed and cxr revealed increased inetrstitial edema. Will give additional  iv lasix 20 mg and one ampoule iv bicarbonate. Oxygen via nasal canula to continue Shortness of breath improved with iv lasix.

## 2017-08-14 NOTE — Plan of Care (Signed)
Problem: Nutrition: Goal: Risk of aspiration will decrease Outcome: Progressing Remains NPO at this time.

## 2017-08-14 NOTE — Progress Notes (Signed)
[place] [facility] [place] NAME: Scott Larsen    MR#:  [id number]  DATE OF BIRTH:  1941-08-22  SUBJECTIVE: Seen at bedside. More alert, awake today asking if he is going to recover from stroke or not.. Still not able to provide any assistance because he is drifting back to sleep. Received IV Lasix last night for shortness of breath. Less gurgling today.   CHIEF COMPLAINT:  No chief complaint on file.   REVIEW OF SYSTEMS:   Review of Systems  Unable to perform ROS: Medical condition    DRUG ALLERGIES:  No Known Allergies  VITALS:  Blood pressure (!) 150/72, pulse 74, temperature (!) 97.4 F (36.3 C), temperature source Oral, resp. rate 18, height 5\' 10"  (1.778 m), weight 69.9 kg (154 lb 1.6 oz), SpO2 97 %.  PHYSICAL EXAMINATION:  GENERAL:  76 y.o.-year-old patient lying in the bed,less confused. EYES: Pupils equal, round, reactive to light   No scleral icterus.  HEENT: Head atraumatic, normocephalic. Oropharynx and nasopharynx clear.  NECK:  Supple, no jugular venous distention. No thyroid enlargement, no tenderness.  LUNGS:  clearto auscultation. CARDIOVASCULAR: S1, S2 tachycardic.Marland Kitchen No murmurs, rubs, or gallops.  ABDOMEN: Soft, nontender, nondistended. Bowel sounds present. No organomegaly or mass.  EXTREMITIES: No pedal edema, cyanosis, or clubbing.  NEUROLOGIC: Confused due to stroke, but able to lift both hands today slightly weak on left side and also has weakness on both legs.  PSYCHIATRIC: Confused. SKIN: No obvious rash, lesion, or ulcer.    LABORATORY PANEL:   CBC  Recent Labs Lab 08/14/17 0911  WBC 9.0  HGB 10.4*  HCT 29.6*  PLT 213   ------------------------------------------------------------------------------------------------------------------  Chemistries   Recent Labs Lab 08/11/17 0545 08/13/17 1725  NA 140  --   K 4.4 4.7  CL 112*  --   CO2 23  --   GLUCOSE 85  --   BUN 15  --   CREATININE  1.45*  --   CALCIUM 7.4*  --   MG  --  1.8   ------------------------------------------------------------------------------------------------------------------  Cardiac Enzymes No results for input(s): TROPONINI in the last 168 hours. ------------------------------------------------------------------------------------------------------------------  RADIOLOGY:  Dg Chest 1 View  Result Date: 08/13/2017 CLINICAL DATA:  Hypoxia EXAM: CHEST 1 VIEW COMPARISON:  08/10/2017 FINDINGS: Postoperative changes in the mediastinum. There is increasing interstitial and early alveolar infiltration in both lungs since previous study suggesting developing edema or pneumonia. This appears to be superimposed upon chronic fibrosis. Bronchial wall thickening may represent chronic bronchitis. No focal consolidation. No blunting of costophrenic angles. No pneumothorax. Normal heart size and pulmonary vascularity. Calcification of the aorta. IMPRESSION: Increasing airspace and interstitial infiltrates in the lungs suggesting progression of edema or pneumonia since previous study. Underlying pulmonary fibrosis and chronic bronchitic changes. Electronically Signed   By: Lucienne Capers M.D.   On: 08/13/2017 23:24   Dg Abd 1 View  Result Date: 08/13/2017 CLINICAL DATA:  MRI clearance. EXAM: ABDOMEN - 1 VIEW COMPARISON:  None. FINDINGS: The bowel gas pattern is normal. No radio-opaque calculi or other significant radiographic abnormality are seen. Right common iliac stent. Atherosclerotic vascular calcifications. Prior CABG. IMPRESSION: 1. Right common iliac artery stent. 2. Nonobstructive bowel gas pattern. Electronically Signed   By: Titus Dubin M.D.   On: 08/13/2017 09:03   Mr Jodene Nam Head Wo Contrast  Result Date: 08/13/2017 CLINICAL DATA:  Stroke follow-up. EXAM: MRI HEAD WITHOUT CONTRAST MRA HEAD WITHOUT CONTRAST TECHNIQUE: Multiplanar, multiecho pulse sequences of the  brain and surrounding structures were obtained  without intravenous contrast. Angiographic images of the head were obtained using MRA technique without contrast. COMPARISON:  CTA of the neck 07/05/2017 FINDINGS: MRI HEAD FINDINGS Brain: There are numerous acute infarcts throughout the infra and supratentorial brain. Moderate sized infarcts are seen in the left medial occipital lobe and high right posterior frontal lobe. Other cerebral infarcts are small, most confluent in the right occipital lobe. Most infarcts are cortically based but there are bilateral deep gray nuclei (specifically left thalamus and right caudate head) and corpus callosum infarcts. Patchy acute infarct in the bilateral cerebellum, in total involving a moderate area. No hemorrhagic conversion is seen. There is confluent FLAIR hyperintensity in the periventricular white matter consistent with chronic small vessel ischemia. Remote lacunar infarct in the right thalamus. Vascular: Arterial findings below. Normal dural venous sinus flow voids. Skull and upper cervical spine: No focal marrow abnormality. Sinuses/Orbits: No acute finding MRA HEAD FINDINGS Significantly motion degraded. The bilateral carotid, vertebral, and basilar arteries are patent. There is symmetric flow in bilateral ACA, MCA, PCA vessels. No proximal flow limiting stenosis or branch occlusion is definitively seen. IMPRESSION: 1. Numerous acute infarcts in multiple vascular distributions, an embolic pattern. The largest infarcts moderately affect the medial left occipital lobe and high posterior right frontal lobe. 2. Background moderate chronic small vessel ischemia. 3. Very motion degraded intracranial MRA. Major vessels are patent; no convincing proximal flow limiting stenosis. 4. Patient motion precluded obtaining a neck MRA. Electronically Signed   By: Monte Fantasia M.D.   On: 08/13/2017 15:56   Mr Brain Wo Contrast  Result Date: 08/13/2017 CLINICAL DATA:  Stroke follow-up. EXAM: MRI HEAD WITHOUT CONTRAST MRA HEAD  WITHOUT CONTRAST TECHNIQUE: Multiplanar, multiecho pulse sequences of the brain and surrounding structures were obtained without intravenous contrast. Angiographic images of the head were obtained using MRA technique without contrast. COMPARISON:  CTA of the neck 07/05/2017 FINDINGS: MRI HEAD FINDINGS Brain: There are numerous acute infarcts throughout the infra and supratentorial brain. Moderate sized infarcts are seen in the left medial occipital lobe and high right posterior frontal lobe. Other cerebral infarcts are small, most confluent in the right occipital lobe. Most infarcts are cortically based but there are bilateral deep gray nuclei (specifically left thalamus and right caudate head) and corpus callosum infarcts. Patchy acute infarct in the bilateral cerebellum, in total involving a moderate area. No hemorrhagic conversion is seen. There is confluent FLAIR hyperintensity in the periventricular white matter consistent with chronic small vessel ischemia. Remote lacunar infarct in the right thalamus. Vascular: Arterial findings below. Normal dural venous sinus flow voids. Skull and upper cervical spine: No focal marrow abnormality. Sinuses/Orbits: No acute finding MRA HEAD FINDINGS Significantly motion degraded. The bilateral carotid, vertebral, and basilar arteries are patent. There is symmetric flow in bilateral ACA, MCA, PCA vessels. No proximal flow limiting stenosis or branch occlusion is definitively seen. IMPRESSION: 1. Numerous acute infarcts in multiple vascular distributions, an embolic pattern. The largest infarcts moderately affect the medial left occipital lobe and high posterior right frontal lobe. 2. Background moderate chronic small vessel ischemia. 3. Very motion degraded intracranial MRA. Major vessels are patent; no convincing proximal flow limiting stenosis. 4. Patient motion precluded obtaining a neck MRA. Electronically Signed   By: Monte Fantasia M.D.   On: 08/13/2017 15:56   US  Carotid Bilateral  Result Date: 08/13/2017 CLINICAL DATA:  Stroke. Recent right carotid artery stent placement. EXAM: BILATERAL CAROTID DUPLEX ULTRASOUND TECHNIQUE: Pearline Cables scale imaging,  color Doppler and duplex ultrasound were performed of bilateral carotid and vertebral arteries in the neck. COMPARISON:  Neck CTA 07/05/2017 Ing carotid duplex 03/21/2010 FINDINGS: Criteria: Quantification of carotid stenosis is based on velocity parameters that correlate the residual internal carotid diameter with NASCET-based stenosis levels, using the diameter of the distal internal carotid lumen as the denominator for stenosis measurement. The following velocity measurements were obtained: RIGHT ICA:  86 cm/sec CCA:  161 cm/sec SYSTOLIC ICA/CCA RATIO:  0.5 DIASTOLIC ICA/CCA RATIO:  0.8 ECA:  105 cm/sec LEFT ICA:  305 cm/sec CCA:  096 cm/sec SYSTOLIC ICA/CCA RATIO:  2.3 DIASTOLIC ICA/CCA RATIO:  2.5 ECA:  226 cm/sec RIGHT CAROTID ARTERY: Diffuse atherosclerotic plaque in the right common carotid artery. There is a vascular stent at the right carotid bulb. Stent is widely patent. External carotid artery is patent with normal waveform. Evaluation of the internal carotid artery is limited but the internal carotid artery is patent with normal waveforms and velocities. RIGHT VERTEBRAL ARTERY: Antegrade flow and normal waveform in the right vertebral artery. LEFT CAROTID ARTERY: Extensive plaque throughout the left common carotid artery. Large amount of shadowing plaque at the left carotid bulb which limits evaluation of this area. Peak systolic velocity in the external carotid artery is mildly elevated. Extensive calcified plaque in the proximal internal carotid artery with limited evaluation. Peak systolic velocity in the proximal internal carotid artery is elevated measuring up to 305 cm/sec. Mid and distal internal carotid artery are patent. 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 Right carotid artery stent is patent. 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 Antegrade flow in the right vertebral artery. Electronically Signed   By: Markus Daft M.D.   On: 08/13/2017 07:54    EKG:   Orders placed or performed in visit on 11/10/13  . EKG 12-Lead    ASSESSMENT AND PLAN:   #1. 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 Confused  Since right carotid surgery,. Initial CAT scan done in PACU was normal but repeat CAT scan done yesterday showed bilateral cerebellar infarcts. On aspirin, Plavix now. .speechtherapy recommended dysphagia 1 diet. Patient MRI of the brain showed numerous acute infarcts in multiple vascular distributions. Physical therapy recommends CIR/  #2, essential hypertension:, Restart her by mouth metoprolol because patient already is on diet, patient is on dysphagia 1 diet.  #3. history of EtOH abuse. Patient   CIWA protocol.continue Thiamine, folic acid. #4/\. recent right carotid endarterectomy on September 13. He  is already on aspirin, Plavix, heparin drip. Management as per vascular.  All the records are reviewed and case discussed with Care Management/Social Workerr. Management plans discussed with the patient, family and they are in agreement.  CODE STATUS: full  TOTAL TIME TAKING CARE OF THIS PATIENT: 35 minutes.   POSSIBLE D/C IN 1-2 DAYS, DEPENDING ON CLINICAL CONDITION.   Epifanio Lesches M.D on 08/14/2017 at 12:23 PM  Between 7am to 6pm - Pager - [phone]  After 6pm go to [email or web address] - password [email or web address] [facility]  Office  [phone]  CC: Primary care physician; Patient, No Pcp Per   Note: This  dictation was prepared with Dragon dictation along with smaller phrase technology. Any transcriptional  errors that result from this process are unintentional.

## 2017-08-15 LAB — HEPARIN LEVEL (UNFRACTIONATED)
HEPARIN UNFRACTIONATED: 0.51 [IU]/mL (ref 0.30–0.70)
Heparin Unfractionated: 0.44 IU/mL (ref 0.30–0.70)
Heparin Unfractionated: 0.49 IU/mL (ref 0.30–0.70)

## 2017-08-15 LAB — CBC
HCT: 27.7 % — ABNORMAL LOW (ref 40.0–52.0)
Hemoglobin: 9.8 g/dL — ABNORMAL LOW (ref 13.0–18.0)
MCH: 36.8 pg — AB (ref 26.0–34.0)
MCHC: 35.3 g/dL (ref 32.0–36.0)
MCV: 104.3 fL — ABNORMAL HIGH (ref 80.0–100.0)
Platelets: 228 10*3/uL (ref 150–440)
RBC: 2.65 MIL/uL — ABNORMAL LOW (ref 4.40–5.90)
RDW: 13.5 % (ref 11.5–14.5)
WBC: 8.2 10*3/uL (ref 3.8–10.6)

## 2017-08-15 MED ORDER — VITAMIN B-1 100 MG PO TABS
100.0000 mg | ORAL_TABLET | Freq: Every day | ORAL | Status: DC
  Administered 2017-08-15 – 2017-08-17 (×3): 100 mg via ORAL
  Filled 2017-08-15 (×3): qty 1

## 2017-08-15 MED ORDER — FOLIC ACID 1 MG PO TABS
1.0000 mg | ORAL_TABLET | Freq: Every day | ORAL | Status: DC
  Administered 2017-08-15 – 2017-08-17 (×3): 1 mg via ORAL
  Filled 2017-08-15 (×3): qty 1

## 2017-08-15 MED ORDER — FAMOTIDINE 20 MG PO TABS
20.0000 mg | ORAL_TABLET | Freq: Two times a day (BID) | ORAL | Status: DC
  Administered 2017-08-15 – 2017-08-17 (×4): 20 mg via ORAL
  Filled 2017-08-15 (×4): qty 1

## 2017-08-15 NOTE — Progress Notes (Signed)
[facility] [place] NAME: Scott Larsen    MR#:  [id number]  DATE OF BIRTH:  1941/05/13  SUBJECTIVE: little bit more alert than yesterday..   CHIEF COMPLAINT:  No chief complaint on file.   REVIEW OF SYSTEMS:   Review of Systems  Unable to perform ROS: Medical condition    DRUG ALLERGIES:  No Known Allergies  VITALS:  Blood pressure (!) 152/55, pulse 96, temperature 98.4 F (36.9 C), temperature source Oral, resp. rate 18, height 5\' 10"  (1.778 m), weight 69.9 kg (154 lb 1.6 oz), SpO2 97 %.  PHYSICAL EXAMINATION:  GENERAL:  76 y.o.-year-old patient lying in the bed,less confused. EYES: Pupils equal, round, reactive to light   No scleral icterus.  HEENT: Head atraumatic, normocephalic. Oropharynx and nasopharynx clear.  NECK:  Supple, no jugular venous distention. No thyroid enlargement, no tenderness.  LUNGS:  clearto auscultation. CARDIOVASCULAR: S1, S2 tachycardic.Marland Kitchen No murmurs, rubs, or gallops.  ABDOMEN: Soft, nontender, nondistended. Bowel sounds present. No organomegaly or mass.  EXTREMITIES: No pedal edema, cyanosis, or clubbing.  NEUROLOGIC: Confused due to stroke, but able to lift both hands today slightly weak on left side and also has weakness on both legs.  PSYCHIATRIC:  More Alert today. SKIN: No obvious rash, lesion, or ulcer.    LABORATORY PANEL:   CBC  Recent Labs Lab 08/15/17 0215  WBC 8.2  HGB 9.8*  HCT 27.7*  PLT 228   ------------------------------------------------------------------------------------------------------------------  Chemistries   Recent Labs Lab 08/11/17 0545 08/13/17 1725  NA 140  --   K 4.4 4.7  CL 112*  --   CO2 23  --   GLUCOSE 85  --   BUN 15  --   CREATININE 1.45*  --   CALCIUM 7.4*  --   MG  --  1.8   ------------------------------------------------------------------------------------------------------------------  Cardiac Enzymes No results for  input(s): TROPONINI in the last 168 hours. ------------------------------------------------------------------------------------------------------------------  RADIOLOGY:  Dg Chest 1 View  Result Date: 08/13/2017 CLINICAL DATA:  Hypoxia EXAM: CHEST 1 VIEW COMPARISON:  08/10/2017 FINDINGS: Postoperative changes in the mediastinum. There is increasing interstitial and early alveolar infiltration in both lungs since previous study suggesting developing edema or pneumonia. This appears to be superimposed upon chronic fibrosis. Bronchial wall thickening may represent chronic bronchitis. No focal consolidation. No blunting of costophrenic angles. No pneumothorax. Normal heart size and pulmonary vascularity. Calcification of the aorta. IMPRESSION: Increasing airspace and interstitial infiltrates in the lungs suggesting progression of edema or pneumonia since previous study. Underlying pulmonary fibrosis and chronic bronchitic changes. Electronically Signed   By: Lucienne Capers M.D.   On: 08/13/2017 23:24   Mr Jodene Nam Head Wo Contrast  Result Date: 08/13/2017 CLINICAL DATA:  Stroke follow-up. EXAM: MRI HEAD WITHOUT CONTRAST MRA HEAD WITHOUT CONTRAST TECHNIQUE: Multiplanar, multiecho pulse sequences of the brain and surrounding structures were obtained without intravenous contrast. Angiographic images of the head were obtained using MRA technique without contrast. COMPARISON:  CTA of the neck 07/05/2017 FINDINGS: MRI HEAD FINDINGS Brain: There are numerous acute infarcts throughout the infra and supratentorial brain. Moderate sized infarcts are seen in the left medial occipital lobe and high right posterior frontal lobe. Other cerebral infarcts are small, most confluent in the right occipital lobe. Most infarcts are cortically based but there are bilateral deep gray nuclei (specifically left thalamus and right caudate head) and corpus callosum infarcts. Patchy acute infarct in the bilateral cerebellum, in total  involving a moderate area. No  hemorrhagic conversion is seen. There is confluent FLAIR hyperintensity in the periventricular white matter consistent with chronic small vessel ischemia. Remote lacunar infarct in the right thalamus. Vascular: Arterial findings below. Normal dural venous sinus flow voids. Skull and upper cervical spine: No focal marrow abnormality. Sinuses/Orbits: No acute finding MRA HEAD FINDINGS Significantly motion degraded. The bilateral carotid, vertebral, and basilar arteries are patent. There is symmetric flow in bilateral ACA, MCA, PCA vessels. No proximal flow limiting stenosis or branch occlusion is definitively seen. IMPRESSION: 1. Numerous acute infarcts in multiple vascular distributions, an embolic pattern. The largest infarcts moderately affect the medial left occipital lobe and high posterior right frontal lobe. 2. Background moderate chronic small vessel ischemia. 3. Very motion degraded intracranial MRA. Major vessels are patent; no convincing proximal flow limiting stenosis. 4. Patient motion precluded obtaining a neck MRA. Electronically Signed   By: Monte Fantasia M.D.   On: 08/13/2017 15:56   Mr Brain Wo Contrast  Result Date: 08/13/2017 CLINICAL DATA:  Stroke follow-up. EXAM: MRI HEAD WITHOUT CONTRAST MRA HEAD WITHOUT CONTRAST TECHNIQUE: Multiplanar, multiecho pulse sequences of the brain and surrounding structures were obtained without intravenous contrast. Angiographic images of the head were obtained using MRA technique without contrast. COMPARISON:  CTA of the neck 07/05/2017 FINDINGS: MRI HEAD FINDINGS Brain: There are numerous acute infarcts throughout the infra and supratentorial brain. Moderate sized infarcts are seen in the left medial occipital lobe and high right posterior frontal lobe. Other cerebral infarcts are small, most confluent in the right occipital lobe. Most infarcts are cortically based but there are bilateral deep gray nuclei (specifically left  thalamus and right caudate head) and corpus callosum infarcts. Patchy acute infarct in the bilateral cerebellum, in total involving a moderate area. No hemorrhagic conversion is seen. There is confluent FLAIR hyperintensity in the periventricular white matter consistent with chronic small vessel ischemia. Remote lacunar infarct in the right thalamus. Vascular: Arterial findings below. Normal dural venous sinus flow voids. Skull and upper cervical spine: No focal marrow abnormality. Sinuses/Orbits: No acute finding MRA HEAD FINDINGS Significantly motion degraded. The bilateral carotid, vertebral, and basilar arteries are patent. There is symmetric flow in bilateral ACA, MCA, PCA vessels. No proximal flow limiting stenosis or branch occlusion is definitively seen. IMPRESSION: 1. Numerous acute infarcts in multiple vascular distributions, an embolic pattern. The largest infarcts moderately affect the medial left occipital lobe and high posterior right frontal lobe. 2. Background moderate chronic small vessel ischemia. 3. Very motion degraded intracranial MRA. Major vessels are patent; no convincing proximal flow limiting stenosis. 4. Patient motion precluded obtaining a neck MRA. Electronically Signed   By: Monte Fantasia M.D.   On: 08/13/2017 15:56    EKG:   Orders placed or performed in visit on 11/10/13  . EKG 12-Lead    ASSESSMENT AND PLAN:   #1. 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 Confused  Since right carotid surgery,. Initial CAT scan done in PACU was normal but repeat CAT scan done yesterday showed bilateral cerebellar infarcts. On aspirin, Plavix now. .speechtherapy recommended dysphagia 1 diet. Patient MRI of the brain showed numerous acute infarcts in multiple vascular distributions.  Physical therapy, slowly progressing. the note from rehabilitation admissions office at [facility] bed availability is limited as per her. #2, essential hypertension:, Restart her by mouth  metoprolol because patient already is on diet, patient is on dysphagia 1 diet.  #3. history of EtOH abuse. Patient   CIWA protocol.continue Thiamine, folic acid.  #4/\. recent right  carotid endarterectomy on September 13. He  is already on aspirin, Plavix, heparin drip. Management as per vascular.  All the records are reviewed and case discussed with Care Management/Social Workerr. Management plans discussed with the patient, family and they are in agreement.  CODE STATUS: full  TOTAL TIME TAKING CARE OF THIS PATIENT: 35 minutes.   POSSIBLE D/C IN 1-2 DAYS, DEPENDING ON CLINICAL CONDITION.   Epifanio Lesches M.D on 08/15/2017 at 11:36 AM  Between 7am to 6pm - Pager - [phone]  After 6pm go to [email or web address] - password [email or web address] [facility]  Office  [phone]  CC: Primary care physician; Patient, No Pcp Per   Note: This dictation was prepared with Dragon dictation along with smaller phrase technology. Any transcriptional errors that result from this process are unintentional.

## 2017-08-15 NOTE — Progress Notes (Signed)
Physical Therapy Treatment Patient Details Name: Scott Larsen MRN: [id number] DOB: 02-23-1941 Today's Date: 08/15/2017    History of Present Illness Pt is a 76 y.o. male s/p R carotid stent placement 08/09/17.  Pt demonstrating post-op confusion, agitation, tachycardic, vision impairments, difficulty following commands, and L sided weakness.  Imaging showing new multiple supra and infratentorial strokes.  PMH includes AAA, PAD, h/o TIA, recurrent R carotid artery stenosis, h/o ETOH abuse, htn, and MI.    PT Comments    Pt tolerated standing with RW for approximately 5 minutes performing static and dynamic balance activities (pt reaching with single UE within BOS but had difficulty finding target with either UE; pt tended to reach below hand placement and moved hand around in order to find therapists hand; possible depth perception impairment; also L UE weakness noted).  Pt able to march in place with UE support on RW but activity limited d/t LE weakness.  Pt did well participating during session.  Will continue to progress pt with strengthening, balance, and increasing functional mobility per pt tolerance.   Follow Up Recommendations  CIR     Equipment Recommendations  Rolling walker with 5" wheels    Recommendations for Other Services       Precautions / Restrictions Precautions Precautions: Fall Restrictions Weight Bearing Restrictions: No    Mobility  Bed Mobility Overal bed mobility: Needs Assistance Bed Mobility: Supine to Sit;Sit to Supine     Supine to sit: Mod assist;Max assist Sit to supine: Mod assist;Max assist;+2 for physical assistance   General bed mobility comments: vc's and assist to bridge and shift hips to R; assist for L LE and trunk supine to sit; vc's for hand placement and technique; increased time to perform; assist to scoot forward onto edge of bed; assist for trunk and B LE's for safety sit to supine  Transfers Overall transfer level: Needs  assistance Equipment used: Rolling walker (2 wheeled) Transfers: Sit to/from Stand Sit to Stand: Min assist;Mod assist;+2 physical assistance         General transfer comment: x2 trials; vc's for UE and LE placement; assist to initiate stand and to come to full upright posture; vc's to lean forward to stand  Ambulation/Gait Ambulation/Gait assistance: Min assist;+2 physical assistance Ambulation Distance (Feet):  (marching in place) Assistive device: Rolling walker (2 wheeled)   Gait velocity: decreased   General Gait Details: pt marched in place x10 reps B LE's; limited d/t LE weakness; decreased B LE foot clearance   Stairs            Wheelchair Mobility    Modified Rankin (Stroke Patients Only)       Balance Overall balance assessment: Needs assistance Sitting-balance support: No upper extremity supported;Feet supported Sitting balance-Leahy Scale: Fair Sitting balance - Comments: static sitting Postural control: Posterior lean Standing balance support: Bilateral upper extremity supported (on RW) Standing balance-Leahy Scale: Poor Standing balance comment: initial posterior lean standing but able to correct with vc's for upright posture and to shift weight forward                            Cognition Arousal/Alertness: Awake/alert Behavior During Therapy: Anxious;Flat affect Overall Cognitive Status: No family/caregiver present to determine baseline cognitive functioning (Oriented to name and DOB)                 Rancho Levels of Cognitive Functioning Rancho Los Amigos Scales of Cognitive Functioning: (P) Confused/appropriate  General Comments: Pt reports being in some weird small hospital.  Also talking about being locked in cage recently for 6 hours.  Did not know reason for hospitalization or date.      Exercises Total Joint Exercises Ankle Circles/Pumps: AROM;Strengthening;10 reps;Supine Short Arc Quad:  AROM;Strengthening;Both;10 reps;Supine Heel Slides: Strengthening;Both;10 reps;Supine Hip ABduction/ADduction: AROM;Strengthening;Both;10 reps;Supine  Vc's, tactile cues, and visual cues provided for above ex's.    General Comments   Nursing cleared pt for participation in physical therapy.  Pt agreeable to PT session.      Pertinent Vitals/Pain Pain Assessment: Faces Faces Pain Scale: Hurts a little bit Pain Location: L thigh "cramps a little" Pain Descriptors / Indicators: Cramping Pain Intervention(s): Limited activity within patient's tolerance;Monitored during session;Repositioned  Vitals (HR and O2 on 2 L via nasal cannula) stable and WFL throughout treatment session.    Home Living                      Prior Function            PT Goals (current goals can now be found in the care plan section) Acute Rehab PT Goals Patient Stated Goal: to get stronger PT Goal Formulation: With patient Time For Goal Achievement: 08/27/17 Potential to Achieve Goals: Good Progress towards PT goals: Progressing toward goals    Frequency    7X/week      PT Plan Current plan remains appropriate    Co-evaluation              AM-PAC PT "6 Clicks" Daily Activity  Outcome Measure  Difficulty turning over in bed (including adjusting bedclothes, sheets and blankets)?: Unable Difficulty moving from lying on back to sitting on the side of the bed? : Unable Difficulty sitting down on and standing up from a chair with arms (e.g., wheelchair, bedside commode, etc,.)?: Unable Help needed moving to and from a bed to chair (including a wheelchair)?: Total Help needed walking in hospital room?: Total Help needed climbing 3-5 steps with a railing? : Total 6 Click Score: 1    End of Session Equipment Utilized During Treatment: Gait belt;Oxygen (2 L O2 via nasal cannula) Activity Tolerance: Patient tolerated treatment well Patient left: in bed;with call bell/phone within  reach;with bed alarm set (B heels elevated via pillows) Nurse Communication: Mobility status;Precautions PT Visit Diagnosis: Other abnormalities of gait and mobility (R26.89);Muscle weakness (generalized) (M62.81);Unsteadiness on feet (R26.81);Hemiplegia and hemiparesis Hemiplegia - Right/Left: Left Hemiplegia - caused by: Cerebral infarction     Time: 0950-1040 PT Time Calculation (min) (ACUTE ONLY): 50 min  Charges:  $Therapeutic Exercise: 8-22 mins $Therapeutic Activity: 23-37 mins                    G CodesLeitha Bleak, PT 08/15/17, 1:26 PM [phone]

## 2017-08-15 NOTE — Progress Notes (Signed)
Inpatient Rehabilitation  Discussed case with Nann, case Freight forwarder.  Patient slowly progressing and unsure of 24/7 care giver availability at this time.  Of note, [facility] also has limited bad availability this week.  Plan to continue to follow at a distance for potential candidacy.  Please call with questions.   Carmelia Roller., CCC/SLP Admission Coordinator  [facility]  Cell [phone]

## 2017-08-15 NOTE — Progress Notes (Signed)
ANTICOAGULATION CONSULT NOTE - Initial Consult  Pharmacy Consult for Heparin Drip  Indication: stroke  No Known Allergies  Patient Measurements: Height: 5\' 10"  (177.8 cm) Weight: 154 lb 1.6 oz (69.9 kg) IBW/kg (Calculated) : 73  Vital Signs: Temp: 97.8 F (36.6 C) (09/19 0352) Temp Source: Oral (09/19 0352) BP: 143/80 (09/19 0352) Pulse Rate: 88 (09/19 0352)  Labs:  Recent Labs  08/13/17 0623  08/14/17 0911 08/14/17 1759 08/15/17 0215  HGB 10.6*  --  10.4*  --  9.8*  HCT 30.5*  --  29.6*  --  27.7*  PLT 169  --  213  --  228  HEPARINUNFRC 0.33  < > 0.43 0.50 0.51  < > = values in this interval not displayed.  Estimated Creatinine Clearance: 44.2 mL/min (A) (by C-G formula based on SCr of 1.45 mg/dL (H)).   Medical History: Past Medical History:  Diagnosis Date  . Cancer ([place])   . Coronary artery disease   . History of kidney stones    40 years ago  . Hypertension   . Myocardial infarction [facility])     Assessment: 76 yo male with possible  ischemic stroke. Pharmacy consulted for heparin dosing and monitoring. No bolus per consult.  Goal of Therapy:  Heparin level 0.3-0.5 units/ml Monitor platelets by anticoagulation protocol: Yes   Plan:  HL = 0.50 which is on the high end of therapeutic range. Will slightly decrease heparin drip to 1200 units/hr and recheck HL in 6 hours. CBC ordered with AM labs tomorrow.   0919 0200 heparin level 0.51. Decrease to 1100 units/hr and recheck in 8 hours.  Yoshiye Kraft S, Pharm.D., BCPS Clinical Pharmacist 08/15/2017 4:05 AM

## 2017-08-15 NOTE — Progress Notes (Addendum)
ANTICOAGULATION CONSULT NOTE - Initial Consult  Pharmacy Consult for Heparin Drip  Indication: stroke  No Known Allergies  Patient Measurements: Height: 5\' 10"  (177.8 cm) Weight: 154 lb 1.6 oz (69.9 kg) IBW/kg (Calculated) : 73  Vital Signs: Temp: 98.1 F (36.7 C) (09/19 2019) Temp Source: Oral (09/19 2019) BP: 158/79 (09/19 2019) Pulse Rate: 96 (09/19 2019)  Labs:  Recent Labs  08/13/17 0349  08/14/17 0911  08/15/17 0215 08/15/17 1251 08/15/17 2101  HGB 10.6*  --  10.4*  --  9.8*  --   --   HCT 30.5*  --  29.6*  --  27.7*  --   --   PLT 169  --  213  --  228  --   --   HEPARINUNFRC 0.33  < > 0.43  < > 0.51 0.44 0.49  < > = values in this interval not displayed.  Estimated Creatinine Clearance: 44.2 mL/min (A) (by C-G formula based on SCr of 1.45 mg/dL (H)).   Medical History: Past Medical History:  Diagnosis Date  . Cancer ([place])   . Coronary artery disease   . History of kidney stones    40 years ago  . Hypertension   . Myocardial infarction [facility])     Assessment: 76 yo male with possible  ischemic stroke. Pharmacy consulted for heparin dosing and monitoring. No bolus per consult.  Goal of Therapy:  Heparin level 0.3-0.5 units/ml Monitor platelets by anticoagulation protocol: Yes   Plan:  HL = 0.44 was therapeutic this afternoon. Will continue heparin drip at current rate of 1100 unit/hr and order confirmatory 8 hour HL. CBC ordered with AM  labs tomorrow.  9/19:  HL @ 21:00 = 0.49 Will continue this pt on current rate and recheck HL on 9/20 with AM labs.   Robbins,Jason D, Pharm.D Clinical Pharmacist 08/15/2017 10:20 PM   09/20 AM heparin level 0.53. Decrease to 1000 units/hr and recheck in 8 hours.  Sim Boast, PharmD, BCPS  08/16/17 7:13 AM

## 2017-08-15 NOTE — Plan of Care (Signed)
Problem: Nutrition: Goal: Risk of aspiration will decrease Outcome: Progressing Pt on dysphagia 1 diet with honey thickened liquids. Pt sits upright when eating/drinking. Medications crushed in applesauce. Pt tolerating diet well and eating most of his meals.

## 2017-08-15 NOTE — Plan of Care (Signed)
Problem: Health Behavior/Discharge Planning: Goal: Ability to manage health-related needs will improve Outcome: Progressing Ativan given X 1 dose this shift for agitation with attempts to get out of bed.  Calm, cooperative after meds.  Pt without safety sitter for > past 20 hours.  Safety sitter order discontinued

## 2017-08-15 NOTE — Progress Notes (Signed)
[facility]  Daily Progress Note   Subjective  - 6 Days Post-Op  Patient awake and alert, much more conversant today.  Mild confusion but much improved.  Strength seems to have returned.  Working with PT  Objective Vitals:   08/14/17 1207 08/14/17 2010 08/15/17 0352 08/15/17 0846  BP: (!) 150/72 (!) 160/71 (!) 143/80 (!) 152/55  Pulse: 74 94 88 96  Resp: 18 18  18   Temp: (!) 97.4 F (36.3 C) 98.3 F (36.8 C) 97.8 F (36.6 C) 98.4 F (36.9 C)  TempSrc: Oral Oral Oral Oral  SpO2: 97% 98% 97% 97%  Weight:      Height:        Intake/Output Summary (Last 24 hours) at 08/15/17 1345 Last data filed at 08/15/17 1025  Gross per 24 hour  Intake           1027.3 ml  Output                0 ml  Net           1027.3 ml    PULM  CTAB CV  RRR VASC  Moderate, stable bruising from right groin.  Neuro exam improving as above  Laboratory CBC    Component Value Date/Time   WBC 8.2 08/15/2017 0215   HGB 9.8 (L) 08/15/2017 0215   HCT 27.7 (L) 08/15/2017 0215   PLT 228 08/15/2017 0215    BMET    Component Value Date/Time   NA 140 08/11/2017 0545   K 4.7 08/13/2017 1725   CL 112 (H) 08/11/2017 0545   CO2 23 08/11/2017 0545   GLUCOSE 85 08/11/2017 0545   BUN 15 08/11/2017 0545   CREATININE 1.45 (H) 08/11/2017 0545   CREATININE 1.74 (H) 02/21/2012 0926   CALCIUM 7.4 (L) 08/11/2017 0545   GFRNONAA 46 (L) 08/11/2017 0545   GFRNONAA 42 (L) 02/21/2012 0926   GFRAA 53 (L) 08/11/2017 0545   GFRAA 50 (L) 02/21/2012 0926    Assessment/Planning: POD #7 s/p right carotid stent   Posterior circulation stroke  Improving  Will likely need rehab at discharge, maybe in 2-3 days  Continue PT/OT  Remove Foley tomorrow.  This had been left in due to immobility and inability to use urinal up until now.    Leotis Pain  08/15/2017, 1:45 PM

## 2017-08-15 NOTE — Clinical Social Work Note (Signed)
CSW received consult that patient will probably need SNF.  CSW attempted to see patient to complete assessment and discuss SNF placement, however patient was sleeping and did not wake up.  CSW will attempt at a later time, CSW to continue to follow patient's progress throughout discharge planning.  Jones Broom. [place], MSW, [place]  08/15/2017 6:30 PM

## 2017-08-15 NOTE — Progress Notes (Signed)
Occupational Therapy Treatment Patient Details Name: Scott Larsen MRN: [id number] DOB: 1940-12-19 Today's Date: 08/15/2017    History of present illness Pt is a 76 y.o. male s/p R carotid stent placement 08/09/17.  Pt demonstrating post-op confusion, agitation, tachycardic, vision impairments, difficulty following commands, and L sided weakness.  Imaging showing new multiple supra and infratentorial strokes.  PMH includes AAA, PAD, h/o TIA, recurrent R carotid artery stenosis, h/o ETOH abuse, htn, and MI.   OT comments  Pt seen for treatment session this date focused on self care skills and activity tolerance. Pt received lying in bed with nasal cannula off. O2 sats on room air 90-93%, with nasal cannula in place )2 sats up to >95%. Pt able to tolerate sitting EOB for at least 35 minutes for grooming tasks and self feeding (see additional detail about performance below). Pt making good progress in terms of ability to follow commands more consistently (better with 1 step commands and additional time to respond) and seems less confused this date. Able to correctly identify items and colors this date. Had some difficulty with hand eye coordination/depth perception with self feeding (see details below). Pt still very appropriate for CIR following hospitalization. Will continue to progress.   Follow Up Recommendations  CIR    Equipment Recommendations  Other (comment) (TBD)    Recommendations for Other Services Rehab consult    Precautions / Restrictions Precautions Precautions: Fall Precaution Comments: dysphasia 1 - honey thick liquids, puree Restrictions Weight Bearing Restrictions: No       Mobility Bed Mobility Overal bed mobility: Needs Assistance Bed Mobility: Supine to Sit;Sit to Supine     Supine to sit: Mod assist;Max assist Sit to supine: Max assist   General bed mobility comments: with time/verbal cues pt able to move BLE to edge of bed one and a time, ultimately  required mod-max assist for trunk support and BLE suppor to swing BLE off EOB. Max assist back to bed  Transfers Overall transfer level: Needs assistance Equipment used: None Transfers: Lateral/Scoot Transfers        Lateral/Scoot Transfers: Mod assist General transfer comment: verbal cues for hand placement and initiation for lateral scoots in bed with mod assist for trunk support to clear buttocks off bed. Good BUE participation in task.    Balance Overall balance assessment: Needs assistance Sitting-balance support: No upper extremity supported;Feet supported Sitting balance-Leahy Scale: Good Sitting balance - Comments: good static sitting, and reaching within base of support                            ADL either performed or assessed with clinical judgement   ADL Overall ADL's : Needs assistance/impaired Eating/Feeding: Moderate assistance;Cueing for sequencing;Set up;Minimal assistance;Maximal assistance Eating/Feeding Details (indicate cue type and reason): Initially seated EOB with supervision and mod assist to grasp and bring honey thick juice cup to mouth with noted tremors in BUE for the effort, no s/s aspiration taking 2 separate sips with increased effort required. OT provided max assist for 3 additional sips to conserve energy for bed mobility. Once back in bed with HOB elevated, mod assist decreasing to min assist for scooping applesauce and bringing to mouth to minimize impact of tremor. Verbal cues to support attention to consistent overshooting reaching for the cup and verbal/visual/tactile cues to support pt's aim (depth perception deficit? visual perceptual deficit?) Grooming: Sitting;Set up;Minimal assistance;Cueing for sequencing;Wash/dry face;Applying deodorant;Brushing hair Grooming Details (indicate cue type and reason):  set up with verbal cues and visual cues to improve visual scanning and reaching for items on tray, 100% accuracy with identifying objects  and colors with time to answer, verbal cues to initiate tasks with 1 step commands and additional time/cues to complete.                                     Vision   Vision Assessment?: Vision impaired- to be further tested in functional context   Perception     Praxis      Cognition Arousal/Alertness: Awake/alert Behavior During Therapy: WFL for tasks assessed/performed Overall Cognitive Status: Impaired/Different from baseline Area of Impairment: Orientation;Memory;Following commands;Problem solving                 Orientation Level: Disoriented to;Place;Situation;Time   Memory: Decreased short-term memory (decreased memory of events leading up to hospitalization, did not recall having surgery) Following Commands: Follows one step commands with increased time     Problem Solving: Slow processing;Decreased initiation;Requires verbal cues General Comments: Pt reports being in some weird small hospital.  Also talking about being locked in cage recently for 6 hours.  Did not know reason for hospitalization or date.        Exercises   Shoulder Instructions       General Comments      Pertinent Vitals/ Pain       Pain Assessment: Faces Faces Pain Scale: Hurts little more Pain Location: abdomen when pt bends forward, per pt report Pain Descriptors / Indicators: Cramping Pain Intervention(s): Limited activity within patient's tolerance;Monitored during session;Repositioned  Home Living                                          Prior Functioning/Environment              Frequency  Min 3X/week        Progress Toward Goals  OT Goals(current goals can now be found in the care plan section)  Progress towards OT goals: Progressing toward goals  Acute Rehab OT Goals Patient Stated Goal: to get stronger OT Goal Formulation: With patient Time For Goal Achievement: 08/28/17 Potential to Achieve Goals: Good  Plan Discharge  plan remains appropriate;Frequency remains appropriate    Co-evaluation                 AM-PAC PT "6 Clicks" Daily Activity     Outcome Measure   Help from another person eating meals?: A Lot Help from another person taking care of personal grooming?: A Little Help from another person toileting, which includes using toliet, bedpan, or urinal?: A Lot Help from another person bathing (including washing, rinsing, drying)?: A Lot Help from another person to put on and taking off regular upper body clothing?: A Lot Help from another person to put on and taking off regular lower body clothing?: A Lot 6 Click Score: 13    End of Session Equipment Utilized During Treatment: Oxygen  OT Visit Diagnosis: Other abnormalities of gait and mobility (R26.89);Other symptoms and signs involving cognitive function;Hemiplegia and hemiparesis Hemiplegia - Right/Left: Left Hemiplegia - dominant/non-dominant: Non-Dominant Hemiplegia - caused by: Cerebral infarction   Activity Tolerance Patient tolerated treatment well   Patient Left in bed;with call bell/phone within reach;with bed alarm set   Nurse Communication  Functional Assessment Tool Used: AM-PAC 6 Clicks Daily Activity;Clinical judgement Functional Limitation: Self care Self Care Current Status (C0034): At least 60 percent but less than 80 percent impaired, limited or restricted Self Care Goal Status (J1791): At least 40 percent but less than 60 percent impaired, limited or restricted   Time: 1415-1528 OT Time Calculation (min): 73 min  Charges: OT G-codes **NOT FOR INPATIENT CLASS** Functional Assessment Tool Used: AM-PAC 6 Clicks Daily Activity;Clinical judgement Functional Limitation: Self care Self Care Current Status (T0569): At least 60 percent but less than 80 percent impaired, limited or restricted Self Care Goal Status (V9480): At least 40 percent but less than 60 percent impaired, limited or restricted OT General  Charges $OT Visit: 1 Visit OT Treatments $Self Care/Home Management : 68-82 mins  Jeni Salles, MPH, MS, OTR/L ascom [phone] 08/15/17, 5:11 PM

## 2017-08-15 NOTE — Evaluation (Addendum)
Speech Language Pathology Evaluation Patient Details Name: Scott Larsen MRN: [id number] DOB: 01-07-1941 Today's Date: 08/15/2017 Time: 1030-1130 SLP Time Calculation (min) (ACUTE ONLY): 60 min  Problem List:  Patient Active Problem List   Diagnosis Date Noted  . Acute CVA (cerebrovascular accident) ([place]) 08/11/2017  . Carotid stenosis, right 08/09/2017  . Tobacco use disorder 06/22/2017  . Carotid stenosis 06/22/2017  . AAA (abdominal aortic aneurysm) without rupture ([place]) 06/22/2017  . PAD (peripheral artery disease) ([place]) 06/22/2017   Past Medical History:  Past Medical History:  Diagnosis Date  . Cancer ([place])   . Coronary artery disease   . History of kidney stones    40 years ago  . Hypertension   . Myocardial infarction [facility])    Past Surgical History:  Past Surgical History:  Procedure Laterality Date  . APPENDECTOMY    . CAROTID ENDARTERECTOMY Right   . CAROTID PTA/STENT INTERVENTION Right 08/09/2017   Procedure: Carotid PTA/Stent Intervention;  Surgeon: Algernon Huxley, MD;  Location: [facility];  Service: Cardiovascular;  Laterality: Right;  . CORONARY ARTERY BYPASS GRAFT     triple bypass  . TONSILLECTOMY     HPI:  Pt is a 76 y.o. male has a past medical history significant for CAD, current ETOH use, and PVD now s/p right CEA now with acute confusion and speech/swallowing issues. Head CT shows posterior circulation infarcts which are new. The pt has been maintained on a heparin drip since his surgery 2 days ago. Currently he is lethargic and confused.; also tachycardic and hypertensive post op. 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 Had a previous endarterectomy several years ago. Also has a left carotid stenosis and 70% range. Pt remains confused but min more alert and attentive today.   Assessment / Plan / Recommendation Clinical Impression  Pt appeared to present w/  moderate-severe Cognitive and mental status deline as evidenced by his performance on the informal assessment of the [place] (SLUMS) Examination. It was administered to assess orientation, memory, mathematical skills, visuospatial skills, and naming. Pt was asleep when ST entered the room but awoke w/ min verbal cues appropriately. Pt's alertness and overall attention waxed and waned throughout the evaluation, possibly impacting his attention and follow through w/ tasks.  During orientation tasks, pt was able to state his name correctly, however, when asked about his education level he stated that he "completed 11 years of college"; unable to verify information at this time. Pt was not oriented to the day of the week or the year, however, he did correctly name [place] as the state he was residing in. Pt required moderate repetition of the questions and moderate verbal cues to reattend to the task often.  During memory tasks, pt was unable to remember the name of five objects after a 3 minute span of time where he was performing other tasks. When asked if he remembered the 5 objects, pt stated "bears that would go that way". When given semantic clues and 3 multiple choice options, pt had only a 20% accuracy in naming the given objects.  During mathematical tasks, pt was unable to calculate 3+20 and needed moderate repetition of the instructions but never achieved the correct answer; did not attempt full task involving subtraction as well. During visuospatial tasks, pt was given a piece of paper with a circle drawn in the middle. The pt was instructed to draw a  clock, however, the pt seemed confused by this task, so the ST student branched down and asked the pt to draw "12" at the top of the circle. The pt was unable to follow this command and stated "I don't see the clock". The pt was then given another piece of paper with a square, triangle, and rectangle drawing. The pt was  distracted by the pencil, so the ST student removed the pencil from the environment. The pt was asked to point to the triangle, which he responded "I don't see anything here". The pt began using his fingers to draw scribbles in the air then said "I'm trying to put it in, but it's not going through". Pt does have an infarct in the occipital lobe but he has been able to see objects in front of him; will need further assessment as to his vision impairments. During naming tasks, pt was asked to name as many animals as he could in 1 minute. Pt requested the instructions be repeated 3 times b/f attempting task. Pt's first response was inappropriately "humans", however, the remainder of the responses were appropriate responses ("bears", "monkeys"). Pt was able to name 6 animals in the first 30 seconds then he was silent for the following 30 seconds despite encouragement/cues.  Pt obtained a score of ~2 out of 30 on the SLUMS indicating that Cognitive decline is present. Pt was not consistently attentive for follow through w/ tasks and was easily distracted by his environment. Pt appears to have deficits in memory, attention, orientation, mathematical skills, visuospatial skills, and naming as evidenced by his profoundly below-average score, and the requirement of moderate+ verbal cues and repeated instructions throughout the entire evaluation. The entire examination was unable to be completed this session d/t pt's presentation of drowsiness and slight agitation w/ continued tasks. Pt appeared to fatigue w/ the exertion of the session and he became less engaged. Pt's language and pragmatic presentation of tangential, vague responses is characteristic of impairment of Right Frontal involvement as seen and discussed on his recent MRI; pt also has Occipital lobe involvement. Recommend ST services in next Rehab setting to address cognitive-linguistic skills, pragmatic skills, and re-education in functional ADL training  setting. Due to pt's decreased cognitive awareness and suspicion of decreased safety awareness, pt will need further assessment of such and 100% supervision for safe decision making at time of discharge and following. Family/caregiver training will be necessary as pt requires mod-max assistance w/ cognitive-communication tasks. NSG/MD/CM updated on pt's status w/ informal assessment today.      SLP Assessment  SLP Recommendation/Assessment: All further Speech Lanaguage Pathology  needs can be addressed in the next venue of care SLP Visit Diagnosis: Attention and concentration deficit;Frontal lobe and executive function deficit (previous right frontal lobe CVA) Attention and concentration deficit following: Cerebral infarction Frontal lobe and executive function deficit following: Cerebral infarction    Follow Up Recommendations       Frequency and Duration           SLP Evaluation Cognition  Overall Cognitive Status: No family/caregiver present to determine baseline cognitive functioning (Oriented to name and DOB) Arousal/Alertness: Lethargic Orientation Level: Oriented to person;Oriented to place;Disoriented to time;Disoriented to situation Attention: Sustained Sustained Attention: Impaired Sustained Attention Impairment: Functional complex;Verbal basic;Verbal complex Memory: Impaired Memory Impairment: Storage deficit;Retrieval deficit;Decreased recall of new information Problem Solving: Impaired Problem Solving Impairment: Verbal basic;Functional basic;Verbal complex;Functional complex Executive Function: Sequencing;Organizing;Initiating;Self Monitoring;Self Correcting Sequencing: Impaired Sequencing Impairment: Verbal basic;Functional basic Organizing: Impaired Organizing Impairment: Verbal basic;Functional  basic Initiating: Impaired Initiating Impairment: Verbal complex;Functional complex Self Monitoring: Impaired Self Monitoring Impairment: Verbal complex;Functional  complex Self Correcting: Impaired Self Correcting Impairment: Verbal complex;Functional complex;Functional basic Behaviors: Restless;Impulsive;Verbal agitation Safety/Judgment: Impaired [employer] Scales of Cognitive Functioning: Confused/appropriate       Comprehension  Auditory Comprehension Yes/No Questions: Impaired Basic Immediate Environment Questions: 50-74% accurate Complex Questions: 25-49% accurate Paragraph Comprehension (via yes/no questions): 0-25% accurate Commands: Impaired One Step Basic Commands: 50-74% accurate Two Step Basic Commands: 25-49% accurate Conversation: Simple Interfering Components: Attention;Visual impairments (CVA in occipital lobe) EffectiveTechniques: Extra processing time;Repetition;Visual/Gestural cues;Pausing Visual Recognition/Discrimination Discrimination: Not tested Reading Comprehension Reading Status: Not tested    Expression Expression Primary Mode of Expression: Verbal Verbal Expression Overall Verbal Expression: Impaired (Unsure of baseline status) Initiation: Impaired Level of Generative/Spontaneous Verbalization: Conversation Repetition: Impaired Level of Impairment: Word level Naming: Not tested Pragmatics: Impairment Impairments: Eye contact;Topic appropriateness;Topic maintenance;Turn Taking Interfering Components: Attention (unsure of cognitive baseline) Effective Techniques: Semantic cues;Phonemic cues Written Expression Dominant Hand: Right Written Expression: Not tested   Oral / Motor  Oral Motor/Sensory Function Overall Oral Motor/Sensory Function: Within functional limits Motor Speech Overall Motor Speech: Appears within functional limits for tasks assessed Respiration: Within functional limits Phonation: Normal;Low vocal intensity Resonance: Within functional limits Articulation: Within functional limitis Intelligibility: Intelligible Motor Planning: Witnin functional limits Motor Speech Errors: Not  applicable   GO                   Carolynn Sayers, SLP-Graduate Student Carolynn Sayers 08/15/2017, 1:58 PM   This information has been reviewed and agreed upon by this supervising clinician.  This patient note, response to treatment and overall treatment plan has been reviewed and this clinician agrees with the information provided.  08/15/17, 3:26 PM [place], [place], CCC-SLP

## 2017-08-15 NOTE — Care Management (Addendum)
CM has not received call back from patient's brother to confirm and or clarify the amount of caregiver support patient would have when he discharges home.  At present, patient will need round the clock caregiver support due to his cognitive delay in additional to his functional deficits from his stroke.  OT informed CM that patient participated with her for approximately one hour session and relayed that patient did the same for physical therapy which is a marked improvement.

## 2017-08-15 NOTE — Progress Notes (Signed)
ANTICOAGULATION CONSULT NOTE - Initial Consult  Pharmacy Consult for Heparin Drip  Indication: stroke  No Known Allergies  Patient Measurements: Height: 5\' 10"  (177.8 cm) Weight: 154 lb 1.6 oz (69.9 kg) IBW/kg (Calculated) : 73  Vital Signs: Temp: 98.4 F (36.9 C) (09/19 0846) Temp Source: Oral (09/19 0846) BP: 152/55 (09/19 0846) Pulse Rate: 96 (09/19 0846)  Labs:  Recent Labs  08/13/17 0623  08/14/17 0911 08/14/17 1759 08/15/17 0215 08/15/17 1251  HGB 10.6*  --  10.4*  --  9.8*  --   HCT 30.5*  --  29.6*  --  27.7*  --   PLT 169  --  213  --  228  --   HEPARINUNFRC 0.33  < > 0.43 0.50 0.51 0.44  < > = values in this interval not displayed.  Estimated Creatinine Clearance: 44.2 mL/min (A) (by C-G formula based on SCr of 1.45 mg/dL (H)).   Medical History: Past Medical History:  Diagnosis Date  . Cancer ([place])   . Coronary artery disease   . History of kidney stones    40 years ago  . Hypertension   . Myocardial infarction [facility])     Assessment: 76 yo male with possible  ischemic stroke. Pharmacy consulted for heparin dosing and monitoring. No bolus per consult.  Goal of Therapy:  Heparin level 0.3-0.5 units/ml Monitor platelets by anticoagulation protocol: Yes   Plan:  HL = 0.44 was therapeutic this afternoon. Will continue heparin drip at current rate of 1100 unit/hr and order confirmatory 8 hour HL. CBC ordered with AM labs tomorrow.  Lenis Noon, Pharm.D., BCPS Clinical Pharmacist 08/15/2017 4:21 PM

## 2017-08-15 NOTE — Plan of Care (Signed)
Problem: Education: Goal: Knowledge of secondary prevention will improve Outcome: Not Progressing Remains confused with intermittent periods of agitation and impulsiveness.  Cooperative with redirecting.

## 2017-08-16 LAB — BASIC METABOLIC PANEL
Anion gap: 6 (ref 5–15)
BUN: 20 mg/dL (ref 6–20)
CHLORIDE: 113 mmol/L — AB (ref 101–111)
CO2: 26 mmol/L (ref 22–32)
CREATININE: 1.23 mg/dL (ref 0.61–1.24)
Calcium: 7.9 mg/dL — ABNORMAL LOW (ref 8.9–10.3)
GFR calc non Af Amer: 56 mL/min — ABNORMAL LOW (ref >60)
GLUCOSE: 113 mg/dL — AB (ref 65–99)
Potassium: 3.7 mmol/L (ref 3.5–5.1)
Sodium: 145 mmol/L (ref 135–145)

## 2017-08-16 LAB — CBC
HCT: 28.8 % — ABNORMAL LOW (ref 40.0–52.0)
HEMOGLOBIN: 10 g/dL — AB (ref 13.0–18.0)
MCH: 36.8 pg — AB (ref 26.0–34.0)
MCHC: 34.7 g/dL (ref 32.0–36.0)
MCV: 105.9 fL — AB (ref 80.0–100.0)
PLATELETS: 254 10*3/uL (ref 150–440)
RBC: 2.71 MIL/uL — AB (ref 4.40–5.90)
RDW: 13.5 % (ref 11.5–14.5)
WBC: 8.4 10*3/uL (ref 3.8–10.6)

## 2017-08-16 LAB — HEPARIN LEVEL (UNFRACTIONATED)
HEPARIN UNFRACTIONATED: 0.41 [IU]/mL (ref 0.30–0.70)
Heparin Unfractionated: 0.53 IU/mL (ref 0.30–0.70)
Heparin Unfractionated: 0.36 IU/mL (ref 0.30–0.70)

## 2017-08-16 MED ORDER — SODIUM CHLORIDE 0.9% FLUSH
3.0000 mL | Freq: Two times a day (BID) | INTRAVENOUS | Status: DC
  Administered 2017-08-16 – 2017-08-17 (×3): 3 mL via INTRAVENOUS

## 2017-08-16 NOTE — Care Management (Signed)
Spoke with attending and patient is medically stable for discharge. Patient's brother Jeneen Rinks did not return CM call from 9/19 but CM spoke with him and he confirmed "there is no family support."  Spoke with Genie from CIR and due to the lack of adequate support at discharge, will not qualify for inpatient rehab.  Updated james and CSW on  the need to [facility].  Referral to Amy shepherd with financial services regarding medicaid application and she has been in contact with Jeneen Rinks.

## 2017-08-16 NOTE — Progress Notes (Signed)
ANTICOAGULATION CONSULT NOTE - Initial Consult  Pharmacy Consult for Heparin Drip  Indication: stroke  No Known Allergies  Patient Measurements: Height: 5\' 10"  (177.8 cm) Weight: 154 lb 1.6 oz (69.9 kg) IBW/kg (Calculated) : 73  Vital Signs: Temp: 98.7 F (37.1 C) (09/20 0756) Temp Source: Oral (09/20 0756) BP: 107/71 (09/20 0756) Pulse Rate: 86 (09/20 0756)  Labs:  Recent Labs  08/14/17 0911  08/15/17 0215  08/15/17 2101 08/16/17 0606 08/16/17 1544  HGB 10.4*  --  9.8*  --   --  10.0*  --   HCT 29.6*  --  27.7*  --   --  28.8*  --   PLT 213  --  228  --   --  254  --   HEPARINUNFRC 0.43  < > 0.51  < > 0.49 0.53 0.41  CREATININE  --   --   --   --   --  1.23  --   < > = values in this interval not displayed.  Estimated Creatinine Clearance: 52.1 mL/min (by C-G formula based on SCr of 1.23 mg/dL).   Medical History: Past Medical History:  Diagnosis Date  . Cancer ([place])   . Coronary artery disease   . History of kidney stones    40 years ago  . Hypertension   . Myocardial infarction [facility])     Assessment: 76 yo male with possible  ischemic stroke. Pharmacy consulted for heparin dosing and monitoring. No bolus per consult.  Goal of Therapy:  Heparin level 0.3-0.5 units/ml Monitor platelets by anticoagulation protocol: Yes   Plan:  HL = 0.41 is therapeutic. Will continue heparin drip at current rate of 1000 units/hr and order confirmatory HL in 8 hours. CBC ordered with AM labs tomorrow.  Lenis Noon, PharmD, BCPS 08/16/17 4:23 PM

## 2017-08-16 NOTE — Consult Note (Signed)
[facility] Consultation Note  Patient ID: Scott Larsen, MRN: [id number], DOB/AGE: [birth year] 75 y.o. Admit date: 08/09/2017   Date of Consult: 08/16/2017 Primary Physician: Patient, No Pcp Per Primary Cardiologist: Paraschos  Chief Complaint: No chief complaint on file.  Reason for Consult: strokes with cardiovascular risk  HPI: 76 y.o. male with known coronary artery disease essential hypertension mixed hyperlipidemia and previous myocardial infarction who has had significant vascular disease and recent bilateral cerebellar strokes. The patient has had some improvements of the cerebellar strokes but still is working with the physical therapy. There is concerns that this is more than just sclerotic plaque and could be other issues including other embolic phenomenon. The patient has had no evidence of atrial fibrillation or other rhythm disturbances suggesting that the there is other sources at this time. The patient has been on appropriate medication management including high intensity cholesterol therapy with atorvastatin. There is dual antiplatelet therapy as well which has worked fairly well. Patient does have metoprolol and other medication management for hypertension control stable. Currently there is no evidence of congestive heart failure or myocardial infarction  Past Medical History:  Diagnosis Date  . Cancer ([place])   . Coronary artery disease   . History of kidney stones    40 years ago  . Hypertension   . Myocardial infarction [facility])       Surgical History:  Past Surgical History:  Procedure Laterality Date  . APPENDECTOMY    . CAROTID ENDARTERECTOMY Right   . CAROTID PTA/STENT INTERVENTION Right 08/09/2017   Procedure: Carotid PTA/Stent Intervention;  Surgeon: Algernon Huxley, MD;  Location: [facility];  Service: Cardiovascular;  Laterality: Right;  . CORONARY ARTERY BYPASS GRAFT     triple bypass  . TONSILLECTOMY       Home Meds: Prior to  Admission medications   Medication Sig Start Date End Date Taking? Authorizing Provider  acetaminophen (TYLENOL) 500 MG tablet Take 500 mg by mouth every 6 (six) hours as needed for mild pain or headache.   Yes [provider]  aspirin EC 325 MG tablet Take 325 mg by mouth daily.    Yes [provider]  clopidogrel (PLAVIX) 75 MG tablet Take 75 mg by mouth daily. 07/10/17  Yes [provider]  metoprolol tartrate (LOPRESSOR) 25 MG tablet Take 25 mg by mouth 2 (two) times daily.  06/09/17  Yes [provider]  simvastatin (ZOCOR) 80 MG tablet Take 80 mg by mouth daily at 6 PM.   Yes [provider]    Inpatient Medications:  . aspirin  81 mg Oral Daily  . atorvastatin  40 mg Oral q1800  . clopidogrel  75 mg Oral Daily  . famotidine  20 mg Oral BID  . folic acid  1 mg Oral Daily  . metoprolol tartrate  25 mg Oral BID  . multivitamin with minerals  1 tablet Oral Daily  . sodium chloride flush  3 mL Intravenous Q12H  . thiamine  100 mg Oral Daily   . sodium chloride    . heparin 1,000 Units/hr (08/16/17 0900)  . magnesium sulfate 1 - 4 g bolus IVPB      Allergies: No Known Allergies  Social History   Social History  . Marital status: Single    Spouse name: N/A  . Number of children: N/A  . Years of education: N/A   Occupational History  . Not on file.   Social History Main Topics  .  Smoking status: Current Every Day Smoker  . Smokeless tobacco: Never Used  . Alcohol use Yes     Comment: 3 beers per day  . Drug use: No  . Sexual activity: Not on file   Other Topics Concern  . Not on file   Social History Narrative  . No narrative on file     No family history on file.   Review of Systems Positive for Stroke Negative for: General:  chills, fever, night sweats or weight changes.  Cardiovascular: PND orthopnea syncope dizziness  Dermatological skin lesions rashes Respiratory: Cough congestion Urologic: Frequent urination  urination at night and hematuria Abdominal: negative for nausea, vomiting, diarrhea, bright red blood per rectum, melena, or hematemesis Neurologic: negative for visual changes, and/or hearing changes  All other systems reviewed and are otherwise negative except as noted above.  Labs: No results for input(s): CKTOTAL, CKMB, TROPONINI in the last 72 hours. Lab Results  Component Value Date   WBC 8.4 08/16/2017   HGB 10.0 (L) 08/16/2017   HCT 28.8 (L) 08/16/2017   MCV 105.9 (H) 08/16/2017   PLT 254 08/16/2017    Recent Labs Lab 08/16/17 0606  NA 145  K 3.7  CL 113*  CO2 26  BUN 20  CREATININE 1.23  CALCIUM 7.9*  GLUCOSE 113*   No results found for: CHOL, HDL, LDLCALC, TRIG No results found for: DDIMER  Radiology/Studies:  Dg Chest 1 View  Result Date: 08/13/2017 CLINICAL DATA:  Hypoxia EXAM: CHEST 1 VIEW COMPARISON:  08/10/2017 FINDINGS: Postoperative changes in the mediastinum. There is increasing interstitial and early alveolar infiltration in both lungs since previous study suggesting developing edema or pneumonia. This appears to be superimposed upon chronic fibrosis. Bronchial wall thickening may represent chronic bronchitis. No focal consolidation. No blunting of costophrenic angles. No pneumothorax. Normal heart size and pulmonary vascularity. Calcification of the aorta. IMPRESSION: Increasing airspace and interstitial infiltrates in the lungs suggesting progression of edema or pneumonia since previous study. Underlying pulmonary fibrosis and chronic bronchitic changes. Electronically Signed   By: Lucienne Capers M.D.   On: 08/13/2017 23:24   Dg Abd 1 View  Result Date: 08/13/2017 CLINICAL DATA:  MRI clearance. EXAM: ABDOMEN - 1 VIEW COMPARISON:  None. FINDINGS: The bowel gas pattern is normal. No radio-opaque calculi or other significant radiographic abnormality are seen. Right common iliac stent. Atherosclerotic vascular calcifications. Prior CABG. IMPRESSION: 1. Right  common iliac artery stent. 2. Nonobstructive bowel gas pattern. Electronically Signed   By: Titus Dubin M.D.   On: 08/13/2017 09:03   Ct Head Wo Contrast  Result Date: 08/11/2017 CLINICAL DATA:  Postop stent placement 2 days ago. Altered mental status. EXAM: CT HEAD WITHOUT CONTRAST TECHNIQUE: Contiguous axial images were obtained from the base of the skull through the vertex without intravenous contrast. COMPARISON:  08/09/2017.  07/05/2017. FINDINGS: Brain: Multiple newly seen infarctions affecting both cerebellar hemispheres. Newly seen infarction the left PCA territories affecting the posteromedial temporal lobes and occipital lobes left worse than right. Newly seen infarction in the right frontal cortical and subcortical brain. Old infarction right thalamus. No evidence of hemorrhage. No shift. No hydrocephalus. No extra-axial collection. Vascular: There is atherosclerotic calcification of the major vessels at the base of the brain. Skull: Normal Sinuses/Orbits: No acute inflammation. 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 Orbits negative. Other: None IMPRESSION: Newly seen infarctions affecting areas of both cerebellar hemispheres, both PCA territories of the posteromedial temporal lobes and occipital lobes left more than right,  and the right frontal cortical and subcortical brain. 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 Electronically Signed   By: Nelson Chimes M.D.   On: 08/11/2017 15:08   Ct Head Wo Contrast  Result Date: 08/09/2017 CLINICAL DATA:  Altered mental status. Change in vision. Impaired memory. EXAM: CT HEAD WITHOUT CONTRAST TECHNIQUE: Contiguous axial images were obtained from the base of the skull through the vertex without intravenous contrast. COMPARISON:  03/20/2010 FINDINGS: Brain: No evidence of acute infarction, hemorrhage, hydrocephalus, extra-axial collection or mass lesion/mass effect. There is mild ventricular and sulcal  enlargement reflecting age appropriate volume loss. Patchy areas of white matter hypoattenuation are noted consistent with chronic microvascular ischemic change. More focal hypoattenuation is noted adjacent to the frontal horn of the left lateral ventricle consistent with an old lacune infarct, stable from the prior exam. Vascular: No hyperdense vessel or unexpected calcification. Skull: Normal. Negative for fracture or focal lesion. Sinuses/Orbits: Visualize globes and orbits are unremarkable. Visualized sinuses and mastoid air cells are clear. Other: None. IMPRESSION: 1. No acute intracranial abnormalities. 2. Age-appropriate volume loss. Mild chronic microvascular ischemic change and old left anterior base a ganglia/deep white matter lacune infarct. Electronically Signed   By: Lajean Manes M.D.   On: 08/09/2017 15:41   Mr Jodene Nam Head Wo Contrast  Result Date: 08/13/2017 CLINICAL DATA:  Stroke follow-up. EXAM: MRI HEAD WITHOUT CONTRAST MRA HEAD WITHOUT CONTRAST TECHNIQUE: Multiplanar, multiecho pulse sequences of the brain and surrounding structures were obtained without intravenous contrast. Angiographic images of the head were obtained using MRA technique without contrast. COMPARISON:  CTA of the neck 07/05/2017 FINDINGS: MRI HEAD FINDINGS Brain: There are numerous acute infarcts throughout the infra and supratentorial brain. Moderate sized infarcts are seen in the left medial occipital lobe and high right posterior frontal lobe. Other cerebral infarcts are small, most confluent in the right occipital lobe. Most infarcts are cortically based but there are bilateral deep gray nuclei (specifically left thalamus and right caudate head) and corpus callosum infarcts. Patchy acute infarct in the bilateral cerebellum, in total involving a moderate area. No hemorrhagic conversion is seen. There is confluent FLAIR hyperintensity in the periventricular white matter consistent with chronic small vessel ischemia. Remote  lacunar infarct in the right thalamus. Vascular: Arterial findings below. Normal dural venous sinus flow voids. Skull and upper cervical spine: No focal marrow abnormality. Sinuses/Orbits: No acute finding MRA HEAD FINDINGS Significantly motion degraded. The bilateral carotid, vertebral, and basilar arteries are patent. There is symmetric flow in bilateral ACA, MCA, PCA vessels. No proximal flow limiting stenosis or branch occlusion is definitively seen. IMPRESSION: 1. Numerous acute infarcts in multiple vascular distributions, an embolic pattern. The largest infarcts moderately affect the medial left occipital lobe and high posterior right frontal lobe. 2. Background moderate chronic small vessel ischemia. 3. Very motion degraded intracranial MRA. Major vessels are patent; no convincing proximal flow limiting stenosis. 4. Patient motion precluded obtaining a neck MRA. Electronically Signed   By: Monte Fantasia M.D.   On: 08/13/2017 15:56   Mr Brain Wo Contrast  Result Date: 08/13/2017 CLINICAL DATA:  Stroke follow-up. EXAM: MRI HEAD WITHOUT CONTRAST MRA HEAD WITHOUT CONTRAST TECHNIQUE: Multiplanar, multiecho pulse sequences of the brain and surrounding structures were obtained without intravenous contrast. Angiographic images of the head were obtained using MRA technique without contrast. COMPARISON:  CTA of the neck 07/05/2017 FINDINGS: MRI HEAD FINDINGS Brain: There are numerous acute infarcts throughout the infra and supratentorial brain. Moderate sized infarcts  are seen in the left medial occipital lobe and high right posterior frontal lobe. Other cerebral infarcts are small, most confluent in the right occipital lobe. Most infarcts are cortically based but there are bilateral deep gray nuclei (specifically left thalamus and right caudate head) and corpus callosum infarcts. Patchy acute infarct in the bilateral cerebellum, in total involving a moderate area. No hemorrhagic conversion is seen. There is  confluent FLAIR hyperintensity in the periventricular white matter consistent with chronic small vessel ischemia. Remote lacunar infarct in the right thalamus. Vascular: Arterial findings below. Normal dural venous sinus flow voids. Skull and upper cervical spine: No focal marrow abnormality. Sinuses/Orbits: No acute finding MRA HEAD FINDINGS Significantly motion degraded. The bilateral carotid, vertebral, and basilar arteries are patent. There is symmetric flow in bilateral ACA, MCA, PCA vessels. No proximal flow limiting stenosis or branch occlusion is definitively seen. IMPRESSION: 1. Numerous acute infarcts in multiple vascular distributions, an embolic pattern. The largest infarcts moderately affect the medial left occipital lobe and high posterior right frontal lobe. 2. Background moderate chronic small vessel ischemia. 3. Very motion degraded intracranial MRA. Major vessels are patent; no convincing proximal flow limiting stenosis. 4. Patient motion precluded obtaining a neck MRA. Electronically Signed   By: Monte Fantasia M.D.   On: 08/13/2017 15:56   US Carotid Bilateral  Result Date: 08/13/2017 CLINICAL DATA:  Stroke. Recent right carotid artery stent placement. EXAM: BILATERAL CAROTID DUPLEX ULTRASOUND TECHNIQUE: Pearline Cables scale imaging, color Doppler and duplex ultrasound were performed of bilateral carotid and vertebral arteries in the neck. COMPARISON:  Neck CTA 07/05/2017 Ing carotid duplex 03/21/2010 FINDINGS: Criteria: Quantification of carotid stenosis is based on velocity parameters that correlate the residual internal carotid diameter with NASCET-based stenosis levels, using the diameter of the distal internal carotid lumen as the denominator for stenosis measurement. The following velocity measurements were obtained: RIGHT ICA:  86 cm/sec CCA:  025 cm/sec SYSTOLIC ICA/CCA RATIO:  0.5 DIASTOLIC ICA/CCA RATIO:  0.8 ECA:  105 cm/sec LEFT ICA:  305 cm/sec CCA:  852 cm/sec SYSTOLIC ICA/CCA RATIO:   2.3 DIASTOLIC ICA/CCA RATIO:  2.5 ECA:  226 cm/sec RIGHT CAROTID ARTERY: Diffuse atherosclerotic plaque in the right common carotid artery. There is a vascular stent at the right carotid bulb. Stent is widely patent. External carotid artery is patent with normal waveform. Evaluation of the internal carotid artery is limited but the internal carotid artery is patent with normal waveforms and velocities. RIGHT VERTEBRAL ARTERY: Antegrade flow and normal waveform in the right vertebral artery. LEFT CAROTID ARTERY: Extensive plaque throughout the left common carotid artery. Large amount of shadowing plaque at the left carotid bulb which limits evaluation of this area. Peak systolic velocity in the external carotid artery is mildly elevated. Extensive calcified plaque in the proximal internal carotid artery with limited evaluation. Peak systolic velocity in the proximal internal carotid artery is elevated measuring up to 305 cm/sec. Mid and distal internal carotid artery are patent. 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 Right carotid artery stent is patent. 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 Antegrade flow in the right vertebral artery. Electronically Signed   By: Markus Daft M.D.   On: 08/13/2017 07:54  Dg Chest Port 1 View  Result Date: 08/10/2017 CLINICAL DATA:  Rhonchi EXAM: PORTABLE CHEST 1 VIEW COMPARISON:  October 04, 2015 FINDINGS: There is interstitial prominence throughout the mid and lower lung zones. There is atelectatic change in the left base. There is no frank consolidation or appreciable pleural effusion. Heart is upper normal in size with pulmonary  vascularity within normal limits. There is aortic atherosclerosis. Patient is status post coronary artery bypass grafting. There is a stent in the right carotid artery. No focal bone lesions are evident. IMPRESSION: Suspect a degree of fibrotic change in the mid and lower lung zones. A slight degree of superimposed interstitial edema cannot be excluded. There is atelectasis in the left base. No consolidation, however. Heart is upper normal in size. There is aortic atherosclerosis. There is a stent in the right carotid artery. Aortic Atherosclerosis (ICD10-I70.0). Electronically Signed   By: Lowella Grip III M.D.   On: 08/10/2017 20:37    EKG: Normal sinus rhythm  Weights: Filed Weights   08/09/17 1406  Weight: 69.9 kg (154 lb 1.6 oz)     Physical Exam: Blood pressure 107/71, pulse 86, temperature 98.7 F (37.1 C), temperature source Oral, resp. rate 16, height 5\' 10"  (1.778 m), weight 69.9 kg (154 lb 1.6 oz), SpO2 95 %. Body mass index is 22.11 kg/m. General: Well developed, well nourished, in no acute distress. Head eyes ears nose throat: Normocephalic, atraumatic, sclera non-icteric, no xanthomas, nares are without discharge. No apparent thyromegaly and/or mass  Lungs: Normal respiratory effort.  no wheezes, no rales, no rhonchi.  Heart: RRR with normal S1 S2. no murmur gallop, no rub, PMI is normal size and placement, carotid upstroke normal without bruit, jugular venous pressure is normal Abdomen:   non-tender, non-distended with normoactive bowel sounds. No hepatomegaly. No rebound/guarding. No obvious abdominal masses. Abdominal aorta is normal size without bruit Extremities: No edema. no cyanosis, no clubbing, no ulcers  Peripheral : 2+ bilateral upper extremity pulses, 2+ bilateral femoral pulses, 2+ bilateral dorsal pedal pulse Neuro: Alert and oriented. No facial asymmetry. Some focal deficit. Does not move all extremities spontaneously. Musculoskeletal: Normal muscle tone  without kyphosis Psych:  Responds to questions appropriately with a normal affect.    Assessment: 76 year old male with vascular disease with hypertension hyperlipidemia and recent bilateral stroke status post carotid endarterectomy on appropriate medication management without evidence of congestive heart failure and or myocardial infarction  Plan: 1. Supportive care of bilateral strokes and recovery with rehabilitation without restriction 2. No further intervention of mild LV systolic dysfunction with ejection fraction of 40% and no evidence of infarction and or heart failure type symptoms at this time 3. Dual antiplatelet therapy after intervention 4. High intensity cholesterol therapy with atorvastatin 5. Further supportive care of anemia and renal insufficiency which may be contributing to some above issues 6. No further cardiac diagnostics at this time and okay for discharge to home and/or rehabilitation without restriction  Signed, Corey Skains M.D. [facility] 08/16/2017, 12:56 PM

## 2017-08-16 NOTE — Progress Notes (Signed)
[facility] [place] NAME: Scott Larsen    MR#:  [id number]  DATE OF BIRTH:  11/12/1941  SUBJECTIVE: more alert and awake today, saying that he wants to go home. He is able to follow commands better than yesterday. He has language deficits secondary to stroke.   CHIEF COMPLAINT:  No chief complaint on file.   REVIEW OF SYSTEMS:   Review of Systems  Constitutional: Negative for chills and fever.  HENT: Negative for hearing loss.   Eyes: Negative for blurred vision, double vision and photophobia.  Respiratory: Negative for cough, hemoptysis and shortness of breath.   Cardiovascular: Negative for palpitations, orthopnea and leg swelling.  Gastrointestinal: Negative for abdominal pain, diarrhea and vomiting.  Genitourinary: Negative for dysuria and urgency.  Musculoskeletal: Negative for myalgias and neck pain.  Skin: Negative for rash.  Neurological: Negative for dizziness, focal weakness, seizures, weakness and headaches.  Psychiatric/Behavioral: Negative for memory loss. The patient does not have insomnia.     DRUG ALLERGIES:  No Known Allergies  VITALS:  Blood pressure 107/71, pulse 86, temperature 98.7 F (37.1 C), temperature source Oral, resp. rate 16, height 5\' 10"  (1.778 m), weight 69.9 kg (154 lb 1.6 oz), SpO2 95 %.  PHYSICAL EXAMINATION:  GENERAL:  76 y.o.-year-old patient lying in the bed,less confused. EYES: Pupils equal, round, reactive to light   No scleral icterus.  HEENT: Head atraumatic, normocephalic. Oropharynx and nasopharynx clear.  NECK:  Supple, no jugular venous distention. No thyroid enlargement, no tenderness.  LUNGS:  clearto auscultation. CARDIOVASCULAR: S1, S2 tachycardic.Marland Kitchen No murmurs, rubs, or gallops.  ABDOMEN: Soft, nontender, nondistended. Bowel sounds present. No organomegaly or mass.  EXTREMITIES: No pedal edema, cyanosis, or clubbing.  NEUROLOGIC: Alert, awake, oriented, more weakness on  the left side upper and lower extremity. Patient able to tell me his birthday today,  PSYCHIATRIC:  More Alert today. SKIN: No obvious rash, lesion, or ulcer.    LABORATORY PANEL:   CBC  Recent Labs Lab 08/16/17 0606  WBC 8.4  HGB 10.0*  HCT 28.8*  PLT 254   ------------------------------------------------------------------------------------------------------------------  Chemistries   Recent Labs Lab 08/13/17 1725 08/16/17 0606  NA  --  145  K 4.7 3.7  CL  --  113*  CO2  --  26  GLUCOSE  --  113*  BUN  --  20  CREATININE  --  1.23  CALCIUM  --  7.9*  MG 1.8  --    ------------------------------------------------------------------------------------------------------------------  Cardiac Enzymes No results for input(s): TROPONINI in the last 168 hours. ------------------------------------------------------------------------------------------------------------------  RADIOLOGY:  No results found.  EKG:   Orders placed or performed in visit on 11/10/13  . EKG 12-Lead    ASSESSMENT AND PLAN:   #1. 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 Confused  Since right carotid surgery,. Initial CAT scan done in PACU was normal but repeat CAT scan done yesterday showed bilateral cerebellar infarcts. On aspirin, Plavix now. .upgraded  to dysphagia 3 diet today  Patient MRI of the brain showed numerous acute infarcts in multiple vascular distributions.  Physical therapy, slowly progressing.  Now working  on skilled nursing  Placement #2, essential hypertension:, Restarted  His  by mouth metoprolol because patient already is on diet, patient is on dysphagia 1 diet.  #3. history of EtOH abuse. Patient   CIWA protocol.continue Thiamine, folic acid.  #4/\. recent right carotid endarterectomy on September 13. He  is already on aspirin, Plavix, heparin drip.  Management as per vascular.   All the records are reviewed and case discussed with Care Management/Social  Workerr. Management plans discussed with the patient, family and they are in agreement.  CODE STATUS: full  TOTAL TIME TAKING CARE OF THIS PATIENT: 35 minutes.   POSSIBLE D/C IN 1-2 DAYS, DEPENDING ON CLINICAL CONDITION.   Epifanio Lesches M.D on 08/16/2017 at 2:16 PM  Between 7am to 6pm - Pager - [phone]  After 6pm go to [email or web address] - password [email or web address] [facility]  Office  [phone]  CC: Primary care physician; Patient, No Pcp Per   Note: This dictation was prepared with Dragon dictation along with smaller phrase technology. Any transcriptional errors that result from this process are unintentional.

## 2017-08-16 NOTE — Progress Notes (Signed)
  Speech Language Pathology Treatment: Dysphagia  Patient Details Name: Scott Larsen MRN: [id number] DOB: 06-Apr-1941 Today's Date: 08/16/2017 Time: 0810-0855 SLP Time Calculation (min) (ACUTE ONLY): 45 min  Assessment / Plan / Recommendation Clinical Impression  Pt seen for toleration of current diet; trials to upgrade diet to least restrictive diet consistency safely. Pt is more alert and attentive each day but continues to present w/ cognitive-linguistic deficits secondary to recent dx of R frontal lobe CVA (as per MRI, MD). Pt is able to assist in positioning himself w/ cues; states he understands the need for aspiration precautions including small sips and bites, slowly. Suspect pt will need monitoring and supervision for full follow through however.  Pt consumed po trials of Nectar liquids via cup/straw w/ no overt s/s of aspiration noted; no decline in respiratory status or change in vocal quality. Oral phase appeared wfl for bolus management. Pt then consumed trials of mech soft foods w/ similar results and presentation, and no change in status. Pt was given min-mod verbal cues for follow through w/ strategies and aspiration precautions including slowing down and clearing mouth b/t bites. Pt fed self w/ setup assistance d/t min L UE weakness, and overall weakness. Noted min decreased attention during tasks w/ oral intake; redirection given to attend.  Recommend an upgrade to Dysphagia level 3 (mech soft) w/ Nectar liquids; aspiration precautions; monitoring during meals by [place] staff. ST services to continue to f/u w/ dysphagia tx (trials of thin liquids included) and monitoring for appropriate time for MBSS if indicated. Continued education w/ pt, any family that might be present, and staff. CM, NSG and MD updated.     HPI HPI: Pt is a 76 y.o. male has a past medical history significant for CAD, current ETOH use, and PVD now s/p right CEA now with acute confusion and speech/swallowing  issues. Head CT shows posterior circulation infarcts which are new. The pt has been maintained on a heparin drip since his surgery 2 days ago. Currently he is lethargic and confused.; also tachycardic and hypertensive post op. 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 Had a previous endarterectomy several years ago. Also has a left carotid stenosis and 70% range. Pt remains confused but min more alert and attentive today. Pt does have dx of R frontal lobe CVA per MRI, MD notes      SLP Plan  Continue with current plan of care       Recommendations  Diet recommendations: Dysphagia 3 (mechanical soft);Nectar-thick liquid Liquids provided via: Cup;Straw Medication Administration: Crushed with puree (as needed) Supervision: Staff to assist with self feeding;Intermittent supervision to cue for compensatory strategies Compensations: Minimize environmental distractions;Slow rate;Small sips/bites;Lingual sweep for clearance of pocketing;Multiple dry swallows after each bite/sip;Follow solids with liquid Postural Changes and/or Swallow Maneuvers: Seated upright 90 degrees;Upright 30-60 min after meal                General recommendations:  (Dietician f/u) Oral Care Recommendations: Oral care BID;Patient independent with oral care;Staff/trained caregiver to provide oral care Follow up Recommendations: Inpatient Rehab (TBD) SLP Visit Diagnosis: Dysphagia, oropharyngeal phase (R13.12) Frontal lobe and executive function deficit following: Cerebral infarction Plan: Continue with current plan of care       GO                Orinda Kenner, [place], CCC-SLP Luca Dyar 08/16/2017, 12:44 PM

## 2017-08-16 NOTE — Progress Notes (Signed)
[facility]  Daily Progress Note   Subjective  - 7 Days Post-Op  More alert but still some confusion.  Doing well with therapy.  No events  Objective Vitals:   08/15/17 0846 08/15/17 2019 08/16/17 0631 08/16/17 0756  BP: (!) 152/55 (!) 158/79 102/73 107/71  Pulse: 96 96 89 86  Resp: 18 18 18 16   Temp: 98.4 F (36.9 C) 98.1 F (36.7 C) 97.6 F (36.4 C) 98.7 F (37.1 C)  TempSrc: Oral Oral Oral Oral  SpO2: 97% 100% 95% 95%  Weight:      Height:        Intake/Output Summary (Last 24 hours) at 08/16/17 1502 Last data filed at 08/16/17 1453  Gross per 24 hour  Intake              120 ml  Output              600 ml  Net             -480 ml    PULM  CTAB CV  RRR VASC  Strength now 5/5 in all 4 extremities, access site bruising stable  Laboratory CBC    Component Value Date/Time   WBC 8.4 08/16/2017 0606   HGB 10.0 (L) 08/16/2017 0606   HCT 28.8 (L) 08/16/2017 0606   PLT 254 08/16/2017 0606    BMET    Component Value Date/Time   NA 145 08/16/2017 0606   K 3.7 08/16/2017 0606   CL 113 (H) 08/16/2017 0606   CO2 26 08/16/2017 0606   GLUCOSE 113 (H) 08/16/2017 0606   BUN 20 08/16/2017 0606   CREATININE 1.23 08/16/2017 0606   CREATININE 1.74 (H) 02/21/2012 0926   CALCIUM 7.9 (L) 08/16/2017 0606   GFRNONAA 56 (L) 08/16/2017 0606   GFRNONAA 42 (L) 02/21/2012 0926   GFRAA >60 08/16/2017 0606   GFRAA 50 (L) 02/21/2012 0926    Assessment/Planning: POD #7 s/p right carotid stent   Posterior circulation stroke  Improving  Likely to rehab tomorrow on ASA/Plavix    Leotis Pain  08/16/2017, 3:02 PM

## 2017-08-16 NOTE — Progress Notes (Signed)
Physical Therapy Treatment Patient Details Name: Scott Larsen MRN: [id number] DOB: Oct 31, 1941 Today's Date: 08/16/2017    History of Present Illness Pt is a 76 y.o. male s/p R carotid stent placement 08/09/17.  Pt demonstrating post-op confusion, agitation, tachycardic, vision impairments, difficulty following commands, and L sided weakness.  Imaging showing new multiple supra and infratentorial strokes.  PMH includes AAA, PAD, h/o TIA, recurrent R carotid artery stenosis, h/o ETOH abuse, htn, and MI.    PT Comments    Marked progression in overall activity tolerance and functional performance this date.  Able to initiate longer-distance gait training with excellent effort from patient; however, requires consistent min/mod assist from therapist due to balance deficits (R lateral lean/LOB at times). Poor standing balance in narrowed or semi-SLS activities, requiring hands-on assist from therapist throughout. Continues with noted delay in processing, task initiation, but able to complete desired tasks with increased time and short, simple verbal cues. Excellent candidate for comprehensive, multi-disciplinary rehab services upon discharge from acute hospitalization.    Follow Up Recommendations  CIR     Equipment Recommendations  Rolling walker with 5" wheels    Recommendations for Other Services       Precautions / Restrictions Precautions Precautions: Fall Precaution Comments: dysphasia 1 - honey thick liquids, puree Restrictions Weight Bearing Restrictions: No    Mobility  Bed Mobility Overal bed mobility: Needs Assistance Bed Mobility: Supine to Sit;Sit to Supine     Supine to sit: Supervision Sit to supine: Supervision   General bed mobility comments: cuing for positioning/alignment in bed  Transfers Overall transfer level: Needs assistance Equipment used: Rolling walker (2 wheeled) Transfers: Sit to/from Stand Sit to Stand: Min assist;Mod assist;+2 physical  assistance        Lateral/Scoot Transfers: Min assist General transfer comment: cuing for hand placement, task sequencing and initiation. Marked improvement in trunk control and LE strength/power with functional activities  Ambulation/Gait Ambulation/Gait assistance: Mod assist Ambulation Distance (Feet): 50 Feet Assistive device: 2 person hand held assist       General Gait Details: reciprocal stepping with bilat HHA; short, choppy steps with increased sway bilat (R > L), requiring constant hands-on assist for balance recovery.  Poor visual scanning throughout environment; question some degree of R inattention vs field cut?   Stairs            Wheelchair Mobility    Modified Rankin (Stroke Patients Only)       Balance Overall balance assessment: Needs assistance Sitting-balance support: No upper extremity supported;Feet supported Sitting balance-Leahy Scale: Good Sitting balance - Comments: able to shift weight, all planes, and recover without LOB Postural control: Posterior lean Standing balance support: Bilateral upper extremity supported Standing balance-Leahy Scale: Poor Standing balance comment: increased sway R > L, poor ability to self-correct                            Cognition Arousal/Alertness: Awake/alert Behavior During Therapy: WFL for tasks assessed/performed                     Orientation Level: Disoriented to;Place;Time;Situation           Problem Solving: Slow processing;Decreased initiation;Requires verbal cues        Exercises Other Exercises Other Exercises: Unsupported standing, particpated with higher-level balance activities, min/mod assist: alternate LE forward/backward stepping, tandem stance; alternate LE target tapping.  R lateral lean at times, min/mod assist for correction in periods  of modified SLS.  Fair/good visual scanning for targets; mild dysmetria persists.    General Comments        Pertinent  Vitals/Pain Pain Assessment: No/denies pain    Home Living                      Prior Function            PT Goals (current goals can now be found in the care plan section) Acute Rehab PT Goals Patient Stated Goal: to get stronger PT Goal Formulation: With patient Time For Goal Achievement: 08/27/17 Potential to Achieve Goals: Good Progress towards PT goals: Progressing toward goals    Frequency    7X/week      PT Plan Current plan remains appropriate    Co-evaluation              AM-PAC PT "6 Clicks" Daily Activity  Outcome Measure  Difficulty turning over in bed (including adjusting bedclothes, sheets and blankets)?: A Little Difficulty moving from lying on back to sitting on the side of the bed? : A Little Difficulty sitting down on and standing up from a chair with arms (e.g., wheelchair, bedside commode, etc,.)?: Unable Help needed moving to and from a bed to chair (including a wheelchair)?: A Little Help needed walking in hospital room?: A Lot Help needed climbing 3-5 steps with a railing? : A Lot 6 Click Score: 14    End of Session Equipment Utilized During Treatment: Gait belt Activity Tolerance: Patient tolerated treatment well Patient left: in bed;with call bell/phone within reach;with bed alarm set Nurse Communication: Mobility status PT Visit Diagnosis: Other abnormalities of gait and mobility (R26.89);Muscle weakness (generalized) (M62.81);Unsteadiness on feet (R26.81);Hemiplegia and hemiparesis Hemiplegia - Right/Left: Left Hemiplegia - dominant/non-dominant: Non-dominant Hemiplegia - caused by: Cerebral infarction     Time: 1102-1140 PT Time Calculation (min) (ACUTE ONLY): 38 min  Charges:  $Gait Training: 8-22 mins $Therapeutic Activity: 8-22 mins $Neuromuscular Re-education: 8-22 mins                    G Codes:      Princes Finger H. Owens Shark, PT, DPT, NCS 08/16/17, 9:03 PM [phone]

## 2017-08-17 LAB — CBC
HEMATOCRIT: 26.9 % — AB (ref 40.0–52.0)
HEMOGLOBIN: 9.3 g/dL — AB (ref 13.0–18.0)
MCH: 36.3 pg — AB (ref 26.0–34.0)
MCHC: 34.7 g/dL (ref 32.0–36.0)
MCV: 104.7 fL — AB (ref 80.0–100.0)
Platelets: 268 10*3/uL (ref 150–440)
RBC: 2.57 MIL/uL — ABNORMAL LOW (ref 4.40–5.90)
RDW: 13.5 % (ref 11.5–14.5)
WBC: 7.8 10*3/uL (ref 3.8–10.6)

## 2017-08-17 LAB — HEPARIN LEVEL (UNFRACTIONATED): Heparin Unfractionated: 0.38 IU/mL (ref 0.30–0.70)

## 2017-08-17 NOTE — Care Management Important Message (Signed)
Important Message  Patient Details  Name: Scott Larsen MRN: [id number] Date of Birth: 03/03/1941   Medicare Important Message Given:  Yes Signed IM notice given    Katrina Stack, RN 08/17/2017, 11:45 AM

## 2017-08-17 NOTE — NC FL2 (Signed)
[place] LEVEL OF CARE SCREENING TOOL     IDENTIFICATION  Patient Name: Scott Larsen Birthdate: 02/21/1941 Sex: male Admission Date (Current Location): 08/09/2017  [place] and [place] Number:  Engineering geologist and Address:  [facility], [address] [place], [place] [address]      Provider Number: [id number]  Attending Physician Name and Address:  Algernon Huxley, MD  Relative Name and Phone Number:  Nathen May Significant other [phone]     Current Level of Care: Hospital Recommended Level of Care: [place] Prior Approval Number:    Date Approved/Denied:   PASRR Number: [id number]  Discharge Plan: Home    Current Diagnoses: Patient Active Problem List   Diagnosis Date Noted  . Acute CVA (cerebrovascular accident) ([place]) 08/11/2017  . Carotid stenosis, right 08/09/2017  . Tobacco use disorder 06/22/2017  . Carotid stenosis 06/22/2017  . AAA (abdominal aortic aneurysm) without rupture ([place]) 06/22/2017  . PAD (peripheral artery disease) ([place]) 06/22/2017    Orientation RESPIRATION BLADDER Height & Weight     Self, Time, Situation, Place  Normal Continent Weight: 154 lb 1.6 oz (69.9 kg) Height:  5\' 10"  (177.8 cm)  BEHAVIORAL SYMPTOMS/MOOD NEUROLOGICAL BOWEL NUTRITION STATUS      Continent Diet (Regular with thin liquids)  AMBULATORY STATUS COMMUNICATION OF NEEDS Skin   Limited Assist Verbally Normal                       Personal Care Assistance Level of Assistance  Bathing, Feeding, Dressing Bathing Assistance: Limited assistance Feeding assistance: Independent Dressing Assistance: Limited assistance     Functional Limitations Info  Sight, Hearing, Speech Sight Info: Adequate Hearing Info: Adequate Speech Info: Adequate    SPECIAL CARE FACTORS FREQUENCY  PT (By licensed PT), OT (By licensed OT)     PT Frequency: 5x a week OT Frequency: 5x a week             Contractures Contractures Info: Not present    Additional Factors Info  Code Status, Allergies Code Status Info: Full Allergies Info: NKA           Current Medications (08/17/2017):  This is the current hospital active medication list Current Facility-Administered Medications  Medication Dose Route Frequency Provider Last Rate Last Dose  . 0.9 %  sodium chloride infusion  500 mL Intravenous Once PRN Algernon Huxley, MD      . acetaminophen (TYLENOL) tablet 325-650 mg  325-650 mg Oral Q4H PRN Algernon Huxley, MD       Or  . acetaminophen (TYLENOL) suppository 325-650 mg  325-650 mg Rectal Q4H PRN Algernon Huxley, MD   650 mg at 08/10/17 0811  . albuterol (PROVENTIL) (2.5 MG/3ML) 0.083% nebulizer solution 2.5 mg  2.5 mg Nebulization Q4H PRN Schnier, Dolores Lory, MD   2.5 mg at 08/13/17 2245  . alum & mag hydroxide-simeth (MAALOX/MYLANTA) 200-200-20 MG/5ML suspension 15-30 mL  15-30 mL Oral Q2H PRN Algernon Huxley, MD      . aspirin chewable tablet 81 mg  81 mg Oral Daily Epifanio Lesches, MD   81 mg at 08/17/17 0944  . atorvastatin (LIPITOR) tablet 40 mg  40 mg Oral q1800 Algernon Huxley, MD   40 mg at 08/16/17 1806  . clopidogrel (PLAVIX) tablet 75 mg  75 mg Oral Daily Algernon Huxley, MD   75 mg at 08/17/17 0944  . famotidine (PEPCID) tablet 20 mg  20 mg Oral BID Epifanio Lesches, MD   20 mg at 08/17/17 0944  . folic acid (FOLVITE) tablet 1 mg  1 mg Oral Daily Epifanio Lesches, MD   1 mg at 08/17/17 0944  . guaiFENesin-dextromethorphan (ROBITUSSIN DM) 100-10 MG/5ML syrup 15 mL  15 mL Oral Q4H PRN Algernon Huxley, MD      . hydrALAZINE (APRESOLINE) injection 5 mg  5 mg Intravenous Q20 Min PRN Algernon Huxley, MD   5 mg at 08/09/17 1424  . LORazepam (ATIVAN) injection 1-2 mg  1-2 mg Intravenous Q1H PRN Algernon Huxley, MD   1 mg at 08/14/17 2240  . magnesium sulfate IVPB 2 g 50 mL  2 g Intravenous Daily PRN Algernon Huxley, MD      . metoprolol tartrate (LOPRESSOR) injection 5 mg  5 mg Intravenous Q6H  PRN Epifanio Lesches, MD      . metoprolol tartrate (LOPRESSOR) tablet 25 mg  25 mg Oral BID Algernon Huxley, MD   25 mg at 08/17/17 0944  . morphine 2 MG/ML injection 2-5 mg  2-5 mg Intravenous Q1H PRN Algernon Huxley, MD      . multivitamin with minerals tablet 1 tablet  1 tablet Oral Daily Algernon Huxley, MD   1 tablet at 08/17/17 0944  . ondansetron (ZOFRAN) injection 4 mg  4 mg Intravenous Q6H PRN Algernon Huxley, MD      . phenol (CHLORASEPTIC) mouth spray 1 spray  1 spray Mouth/Throat PRN Algernon Huxley, MD   1 spray at 08/13/17 0033  . potassium chloride SA (K-DUR,KLOR-CON) CR tablet 20-40 mEq  20-40 mEq Oral Daily PRN Algernon Huxley, MD      . sodium chloride flush (NS) 0.9 % injection 3 mL  3 mL Intravenous Q12H Algernon Huxley, MD   3 mL at 08/17/17 0956  . thiamine (VITAMIN B-1) tablet 100 mg  100 mg Oral Daily Epifanio Lesches, MD   100 mg at 08/17/17 8242     Discharge Medications: Please see discharge summary for a list of discharge medications.  Relevant Imaging Results:  Relevant Lab Results:   Additional Information SSN [id number]  Ross Ludwig, [place]

## 2017-08-17 NOTE — Discharge Summary (Signed)
[facility]    Discharge Summary    Patient ID:  Scott Larsen MRN: [id number] DOB/AGE: [birth year] 75 y.o.  Admit date: 08/09/2017 Discharge date: 08/17/2017 Date of Surgery: 08/09/2017 Surgeon: Surgeon(s): Cory Kitt, Erskine Squibb, MD  Admission Diagnosis: Right Carotid Stent - Abbott Rep needed  Discharge Diagnoses:  Right Carotid Stent - Abbott Rep needed  Secondary Diagnoses: Past Medical History:  Diagnosis Date  . Cancer ([place])   . Coronary artery disease   . History of kidney stones    40 years ago  . Hypertension   . Myocardial infarction [facility])     Procedure(s): Carotid PTA/Stent Intervention  Discharged Condition: fair  HPI:  Patient with bilateral carotid stenosis, multiple other vascular issues including AAA and PAD.    Hospital Course:  Scott Larsen is a 76 y.o. male is S/P Right Procedure(s): Carotid PTA/Stent Intervention Extubated: POD # 0 Physical exam: strength now 5/5 in all four extremities, confusion improved, access site with moderate bruising Post-op wounds ecchymotic or healing well Pt. Ambulating, voiding and taking PO diet without difficulty. Pt pain controlled with PO pain meds. Labs as below Complications: posterior circulation stroke, improving  Consults:  Treatment Team:  Leotis Pain, MD Epifanio Lesches, MD Corey Skains, MD  Significant Diagnostic Studies: CBC Lab Results  Component Value Date   WBC 7.8 08/17/2017   HGB 9.3 (L) 08/17/2017   HCT 26.9 (L) 08/17/2017   MCV 104.7 (H) 08/17/2017   PLT 268 08/17/2017    BMET    Component Value Date/Time   NA 145 08/16/2017 0606   K 3.7 08/16/2017 0606   CL 113 (H) 08/16/2017 0606   CO2 26 08/16/2017 0606   GLUCOSE 113 (H) 08/16/2017 0606   BUN 20 08/16/2017 0606   CREATININE 1.23 08/16/2017 0606   CREATININE 1.74 (H) 02/21/2012 0926   CALCIUM 7.9 (L) 08/16/2017 0606   GFRNONAA 56 (L) 08/16/2017 0606   GFRNONAA 42 (L) 02/21/2012  0926   GFRAA >60 08/16/2017 0606   GFRAA 50 (L) 02/21/2012 0926   COAG Lab Results  Component Value Date   INR 0.99 08/09/2017     Disposition:  Discharge to :Rehab Discharge Instructions    Call MD for:  redness, tenderness, or signs of infection (pain, swelling, bleeding, redness, odor or green/yellow discharge around incision site)    Complete by:  As directed    Call MD for:  severe or increased pain, loss or decreased feeling  in affected limb(s)    Complete by:  As directed    Call MD for:  temperature >100.5    Complete by:  As directed    Driving Restrictions    Complete by:  As directed    No driving for 3 weeks   No dressing needed    Complete by:  As directed    Replace only if drainage present   Resume previous diet    Complete by:  As directed    Thickened liquids per speech pathology improving to regular diet as tolerated     Allergies as of 08/17/2017   No Known Allergies     Medication List    TAKE these medications   acetaminophen 500 MG tablet Commonly known as:  TYLENOL Take 500 mg by mouth every 6 (six) hours as needed for mild pain or headache.   aspirin EC 325 MG tablet Take 325 mg by mouth daily.   clopidogrel 75 MG tablet Commonly known as:  PLAVIX  Take 75 mg by mouth daily.   metoprolol tartrate 25 MG tablet Commonly known as:  LOPRESSOR Take 25 mg by mouth 2 (two) times daily.   simvastatin 80 MG tablet Commonly known as:  ZOCOR Take 80 mg by mouth daily at 6 PM.            Discharge Care Instructions        Start     Ordered   08/17/17 0000  Resume previous diet    Comments:  Thickened liquids per speech pathology improving to regular diet as tolerated   08/17/17 0844   08/17/17 0000  Driving Restrictions    Comments:  No driving for 3 weeks   08/17/17 0844   08/17/17 0000  No dressing needed    Comments:  Replace only if drainage present   08/17/17 0844   08/17/17 0000  Call MD for:  temperature >100.5      08/17/17 0844   08/17/17 0000  Call MD for:  redness, tenderness, or signs of infection (pain, swelling, bleeding, redness, odor or green/yellow discharge around incision site)     08/17/17 0844   08/17/17 0000  Call MD for:  severe or increased pain, loss or decreased feeling  in affected limb(s)     08/17/17 0844     Verbal and written Discharge instructions given to the patient. Wound care per Discharge AVS Follow-up Information    Nova Schmuhl, Erskine Squibb, MD Follow up in 3 week(s).   Specialties:  Vascular Surgery, Radiology, Interventional Cardiology Contact information: [place] [address] [phone]           Signed: Leotis Pain, MD  08/17/2017, 8:45 AM

## 2017-08-17 NOTE — Progress Notes (Signed)
Speech Language Pathology Treatment: Dysphagia  Patient Details Name: Scott Larsen MRN: [id number] DOB: 11-28-1940 Today's Date: 08/17/2017 Time: 0915-1000 SLP Time Calculation (min) (ACUTE ONLY): 45 min  Assessment / Plan / Recommendation Clinical Impression  Pt seen for toleration of recently upgraded diet to level 3 w/ Nectar liquids; trials to upgrade diet to least restrictive diet consistency safely. Pt is more alert and attentive each day but continues to present w/ min cognitive-linguistic deficits suspect secondary to recent dx of R frontal lobe CVA (as per MRI, MD). Pt is able to assist in positioning himself w/ cues; states he understands the need for aspiration precautions including small sips and bites, slowly. Suspect pt will need monitoring and supervision for full follow through however.  Pt consumed po trials of Thin liquids via cup (NO straw) w/ no overt s/s of aspiration noted; no decline in respiratory status or change in vocal quality during trials. Pt was able to follow instruction for single sips slowly. Oral phase appeared wfl for bolus management. Pt consumed few trials of mech soft foods w/ no deficits noted. Pt was given min-mod verbal cues/reminders for follow through w/ strategies and aspiration precautions including slowing down and clearing mouth b/t bites and NOT talking during oral intake. Pt fed self w/ setup assistance d/t min L UE weakness, and overall weakness. Noted min decreased attention during tasks w/ oral intake; redirection given to attend.  Recommend an upgrade to Dysphagia level 3 (mech soft) w/ Thin liquids; aspiration precautions including NO Straws w/ thin liquids; monitoring during meals by NSG staff for follow through w/ precautions. ST services to continue to f/u w/ toleration of diet and education; monitoring of Pulmonary status for any changes/decline. Continued education w/ pt, any family when present, and staff. CM, NSG and MD updated.    HPI  HPI: Pt is a 76 y.o. male has a past medical history significant for CAD, current ETOH use, and PVD now s/p right CEA now with acute confusion and speech/swallowing issues. Head CT shows posterior circulation infarcts which are new. The pt has been maintained on a heparin drip since his surgery 2 days ago. Currently he is lethargic and confused.; also tachycardic and hypertensive post op. 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 Had a previous endarterectomy several years ago. Also has a left carotid stenosis and 70% range. Pt remains min confused but more alert and attentive today. Pt does have dx of R frontal lobe CVA per MRI, MD notes. MD also suggested min cognitive differences prior to the admission d/t baseline history.       SLP Plan  Continue with current plan of care       Recommendations  Diet recommendations: Dysphagia 3 (mechanical soft);Thin liquid Liquids provided via: Cup;No straw (w/ thin liquids) Medication Administration: Whole meds with puree (as able) Supervision: Patient able to self feed;Intermittent supervision to cue for compensatory strategies;Staff to assist with self feeding Compensations: Minimize environmental distractions;Slow rate;Small sips/bites;Lingual sweep for clearance of pocketing;Multiple dry swallows after each bite/sip;Follow solids with liquid Postural Changes and/or Swallow Maneuvers: Seated upright 90 degrees;Upright 30-60 min after meal                General recommendations:  (Dietician f/u) Oral Care Recommendations: Oral care BID;Patient independent with oral care;Staff/trained caregiver to provide oral care Follow up Recommendations: Skilled Nursing facility (d/c today per CM) SLP Visit Diagnosis: Dysphagia, oropharyngeal phase (R13.12) Attention and  concentration deficit following: Cerebral infarction Frontal lobe and executive function deficit following: Cerebral  infarction Plan: Continue with current plan of care       [place], [place], CCC-SLP Alesana Magistro 08/17/2017, 11:42 AM

## 2017-08-17 NOTE — Clinical Social Work Note (Signed)
CSW presented bed offers to patient and he chose [facility].  [facility] and [facility] today.  CSW updated patient's brother Jeneen Rinks [phone], and left a message on [name] his significant other's voice mail.  Patient to be d/c'ed today to [facility] [place].  Patient and family agreeable to plans will transport via ems RN to call report 500 hall nurse room 505 [phone].  Evette Cristal, MSW, [place]

## 2017-08-17 NOTE — Progress Notes (Addendum)
ANTICOAGULATION CONSULT NOTE - Initial Consult  Pharmacy Consult for Heparin Drip  Indication: stroke  No Known Allergies  Patient Measurements: Height: 5\' 10"  (177.8 cm) Weight: 154 lb 1.6 oz (69.9 kg) IBW/kg (Calculated) : 73  Vital Signs: Temp: 98.3 F (36.8 C) (09/20 2038) Temp Source: Oral (09/20 2038) BP: 143/62 (09/20 2038) Pulse Rate: 97 (09/20 2038)  Labs:  Recent Labs  08/14/17 0911  08/15/17 0215  08/16/17 0606 08/16/17 1544 08/16/17 2302  HGB 10.4*  --  9.8*  --  10.0*  --   --   HCT 29.6*  --  27.7*  --  28.8*  --   --   PLT 213  --  228  --  254  --   --   HEPARINUNFRC 0.43  < > 0.51  < > 0.53 0.41 0.36  CREATININE  --   --   --   --  1.23  --   --   < > = values in this interval not displayed.  Estimated Creatinine Clearance: 52.1 mL/min (by C-G formula based on SCr of 1.23 mg/dL).   Medical History: Past Medical History:  Diagnosis Date  . Cancer ([place])   . Coronary artery disease   . History of kidney stones    40 years ago  . Hypertension   . Myocardial infarction [facility])     Assessment: 76 yo male with possible  ischemic stroke. Pharmacy consulted for heparin dosing and monitoring. No bolus per consult.  Goal of Therapy:  Heparin level 0.3-0.5 units/ml Monitor platelets by anticoagulation protocol: Yes   Plan:  HL = 0.41 is therapeutic. Will continue heparin drip at current rate of 1000 units/hr and order confirmatory HL in 8 hours. CBC ordered with AM labs tomorrow.  9/20 2300 heparin level 0.36. Continue current regimen. Recheck heparin level and CBC with 9/21 AM abs.  09/21 AM heparin level 0.38. Continue current regimen. Recheck heparin level and CBC with tomorrow AM labs.  Benedicta Sultan S, PharmD, BCPS 08/17/17 3:43 AM

## 2017-08-17 NOTE — Clinical Social Work Placement (Signed)
   CLINICAL SOCIAL WORK PLACEMENT  NOTE  Date:  08/17/2017  Patient Details  Name: Scott Larsen MRN: [id number] Date of Birth: 06/19/1941  Clinical Social Work is seeking post-discharge placement for this patient at the [place] level of care (*CSW will initial, date and re-position this form in  chart as items are completed):  Yes   Patient/family provided with [place] Work Department's list of facilities offering this level of care within the geographic area requested by the patient (or if unable, by the patient's family).  Yes   Patient/family informed of their freedom to choose among providers that offer the needed level of care, that participate in Medicare, Medicaid or managed care program needed by the patient, have an available bed and are willing to accept the patient.  Yes   Patient/family informed of [place]'s ownership interest in [facility] and [facility], as well as of the fact that they are under no obligation to receive care at these facilities.  PASRR submitted to EDS on 08/17/17     PASRR number received on 08/17/17     Existing PASRR number confirmed on       FL2 transmitted to all facilities in geographic area requested by pt/family on 08/17/17     FL2 transmitted to all facilities within larger geographic area on       Patient informed that his/her managed care company has contracts with or will negotiate with certain facilities, including the following:        Yes   Patient/family informed of bed offers received.  Patient chooses bed at [facility]     Physician recommends and patient chooses bed at      Patient to be transferred to [facility] on 08/17/17.  Patient to be transferred to facility by [facility]     Patient family notified on 08/17/17 of transfer.  Name of family member notified:  Brother Jeneen Rinks, and significant other Cindy     PHYSICIAN Please sign  FL2     Additional Comment:    _______________________________________________ Ross Ludwig, LCSWA 08/17/2017, 7:55 PM

## 2017-08-17 NOTE — Progress Notes (Signed)
[facility] [place] NAME: Scott Larsen    MR#:  [id number]  DATE OF BIRTH:  05-25-1941  SUBJECTIVE: more alert and awake today,being discharged to rehabilitation.   CHIEF COMPLAINT:  No chief complaint on file.   REVIEW OF SYSTEMS:   Review of Systems  Constitutional: Negative for chills and fever.  HENT: Negative for hearing loss.   Eyes: Negative for blurred vision, double vision and photophobia.  Respiratory: Negative for cough, hemoptysis and shortness of breath.   Cardiovascular: Negative for palpitations, orthopnea and leg swelling.  Gastrointestinal: Negative for abdominal pain, diarrhea and vomiting.  Genitourinary: Negative for dysuria and urgency.  Musculoskeletal: Negative for myalgias and neck pain.  Skin: Negative for rash.  Neurological: Negative for dizziness, focal weakness, seizures, weakness and headaches.  Psychiatric/Behavioral: Negative for memory loss. The patient does not have insomnia.     DRUG ALLERGIES:  No Known Allergies  VITALS:  Blood pressure 108/74, pulse 91, temperature (!) 97.4 F (36.3 C), temperature source Oral, resp. rate 20, height 5\' 10"  (1.778 m), weight 69.9 kg (154 lb 1.6 oz), SpO2 93 %.  PHYSICAL EXAMINATION:  GENERAL:  76 y.o.-year-old patient lying in the bed,less confused. EYES: Pupils equal, round, reactive to light   No scleral icterus.  HEENT: Head atraumatic, normocephalic. Oropharynx and nasopharynx clear.  NECK:  Supple, no jugular venous distention. No thyroid enlargement, no tenderness.  LUNGS:  clearto auscultation. CARDIOVASCULAR: S1, S2 tachycardic.Marland Kitchen No murmurs, rubs, or gallops.  ABDOMEN: Soft, nontender, nondistended. Bowel sounds present. No organomegaly or mass.  EXTREMITIES: No pedal edema, cyanosis, or clubbing.  NEUROLOGIC: Alert, awake, oriented, more weakness on the left side upper and lower extremity. Patient able to tell me his birthday  today,  PSYCHIATRIC:  More Alert today. SKIN: No obvious rash, lesion, or ulcer.    LABORATORY PANEL:   CBC  Recent Labs Lab 08/17/17 0609  WBC 7.8  HGB 9.3*  HCT 26.9*  PLT 268   ------------------------------------------------------------------------------------------------------------------  Chemistries   Recent Labs Lab 08/13/17 1725 08/16/17 0606  NA  --  145  K 4.7 3.7  CL  --  113*  CO2  --  26  GLUCOSE  --  113*  BUN  --  20  CREATININE  --  1.23  CALCIUM  --  7.9*  MG 1.8  --    ------------------------------------------------------------------------------------------------------------------  Cardiac Enzymes No results for input(s): TROPONINI in the last 168 hours. ------------------------------------------------------------------------------------------------------------------  RADIOLOGY:  No results found.  EKG:   Orders placed or performed in visit on 11/10/13  . EKG 12-Lead    ASSESSMENT AND PLAN:   #1. 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 Confused  Since right carotid surgery,. Initial CAT scan done in PACU was normal but repeat CAT scan done yesterday showed bilateral cerebellar infarcts. On aspirin, Plavix now. .on regular diet today.  Patient MRI of the brain showed numerous acute infarcts in multiple vascular distributions.  Physical therapy, slowly progressing.  Going to rehabilitation today. #2, essential hypertension:, Restarted  His  by mouth metoprolol because patient already is on diet, patient is on dysphagia 1 diet.  #3. history of EtOH abuse. .  #4/\. recent right carotid endarterectomy on September 13. He  is already on aspirin, Plavix, stopped heparin drip, being discharged by vascular.  All the records are reviewed and case discussed with Care Management/Social Workerr. Management plans discussed with the patient, family and they are in agreement.  CODE STATUS:  full  TOTAL TIME TAKING CARE OF THIS  PATIENT: 35 minutes.   POSSIBLE D/C IN 1-2 DAYS, DEPENDING ON CLINICAL CONDITION.   Epifanio Lesches M.D on 08/17/2017 at 12:59 PM  Between 7am to 6pm - Pager - [phone]  After 6pm go to [email or web address] - password [email or web address]  Office  [phone]  CC: Primary care physician; Patient, No Pcp Per   Note: This dictation was prepared with Dragon dictation along with smaller phrase technology. Any transcriptional errors that result from this process are unintentional.

## 2017-08-17 NOTE — Progress Notes (Signed)
Patient just left the hospital to [facility] accompanied by 2 EMS personnels. No s/s of pain or any acute distress noted. D/C IV site. Tele box already taken off before this RN resumed care.

## 2017-08-17 NOTE — Progress Notes (Signed)
[facility]  Daily Progress Note   Subjective  - 8 Days Post-Op  Doing better. Excited to get out of hospital  Objective Vitals:   08/16/17 0756 08/16/17 2038 08/17/17 0509 08/17/17 0837  BP: 107/71 (!) 143/62 (!) 152/81 108/74  Pulse: 86 97 88 91  Resp: 16 18 18 20   Temp: 98.7 F (37.1 C) 98.3 F (36.8 C) 97.6 F (36.4 C) (!) 97.4 F (36.3 C)  TempSrc: Oral Oral Oral Oral  SpO2: 95% 95% 94% 93%  Weight:      Height:        Intake/Output Summary (Last 24 hours) at 08/17/17 0841 Last data filed at 08/17/17 0516  Gross per 24 hour  Intake           635.99 ml  Output              850 ml  Net          -214.01 ml    PULM  CTAB CV  RRR VASC  Strength improved, more alert  Laboratory CBC    Component Value Date/Time   WBC 7.8 08/17/2017 0609   HGB 9.3 (L) 08/17/2017 0609   HCT 26.9 (L) 08/17/2017 0609   PLT 268 08/17/2017 0609    BMET    Component Value Date/Time   NA 145 08/16/2017 0606   K 3.7 08/16/2017 0606   CL 113 (H) 08/16/2017 0606   CO2 26 08/16/2017 0606   GLUCOSE 113 (H) 08/16/2017 0606   BUN 20 08/16/2017 0606   CREATININE 1.23 08/16/2017 0606   CREATININE 1.74 (H) 02/21/2012 0926   CALCIUM 7.9 (L) 08/16/2017 0606   GFRNONAA 56 (L) 08/16/2017 0606   GFRNONAA 42 (L) 02/21/2012 0926   GFRAA >60 08/16/2017 0606   GFRAA 50 (L) 02/21/2012 0926    Assessment/Planning: POD #8 s/p right carotid stent   Posterior circulation stroke  Continues to improve  Stop heparin  Ready for discharge to rehab  RTC 2-3 weeks    Scott Larsen  08/17/2017, 8:41 AM

## 2017-08-17 NOTE — Clinical Social Work Note (Signed)
Clinical Social Work Assessment  Patient Details  Name: Scott Larsen MRN: [id number] Date of Birth: 1941/08/19  Date of referral:  08/17/17               Reason for consult:  Facility Placement                Permission sought to share information with:  Facility Sport and exercise psychologist, Family Supports Permission granted to share information::  Yes, Verbal Permission Granted  Name::     Zara Chess [phone] or Nathen May Significant other [phone]   Agency::  SNF admissions  Relationship::     Contact Information:     Housing/Transportation Living arrangements for the past 2 months:  Single Family Home Source of Information:  Patient Patient Interpreter Needed:  None Criminal Activity/Legal Involvement Pertinent to Current Situation/Hospitalization:  No - Comment as needed Significant Relationships:  Significant Other, Siblings Lives with:  Significant Other Do you feel safe going back to the place where you live?  No Need for family participation in patient care:  No (Coment) (Patient sometimes has some confusion.)  Care giving concerns:  Patient feel he needs some short term rehab before he is able to return back home.   Social Worker assessment / plan:  Patient is a 76 year old male who is alert and oriented x4.  Patient lives with his significant other, patient states he has not been to rehab at [facility] before.  CSW explained to patient what to expect, role of CSW, and process for looking for placement.  Patient was explained how insurance will pay for his stay.  Patient stated he would rather go home, but is in agreement to going to SNF first.  Patient has a brother who he is not very close to, but did give CSW permission to contact him and discuss SNF placement options.  Patient gave CSW permission to begin bed search in [place].  Patient did not express any other questions or concerns.  Employment status:  Retired Designer, industrial/product PT Recommendations:  [place] / Referral to community resources:  [place]  Patient/Family's Response to care:  Patient in agreement to going to SNF for short term rehab.  Patient/Family's Understanding of and Emotional Response to Diagnosis, Current Treatment, and Prognosis:  Patient understands current treatment plan and prognosis.  He is hopeful he will not have to be at [facility] for very long.  Emotional Assessment Appearance:  Appears stated age Attitude/Demeanor/Rapport:    Affect (typically observed):  Appropriate, Calm Orientation:  Oriented to Self, Oriented to Place, Oriented to  Time, Oriented to Situation Alcohol / Substance use:  Not Applicable Psych involvement (Current and /or in the community):  No (Comment)  Discharge Needs  Concerns to be addressed:  Lack of Support Readmission within the last 30 days:  No Current discharge risk:  Lack of support system Barriers to Discharge:  No Barriers Identified   Anell Barr 08/17/2017, 7:49 PM

## 2017-09-07 ENCOUNTER — Ambulatory Visit (INDEPENDENT_AMBULATORY_CARE_PROVIDER_SITE_OTHER): Payer: Medicare Other | Admitting: Vascular Surgery

## 2017-10-17 DIAGNOSIS — G4700 Insomnia, unspecified: Secondary | ICD-10-CM | POA: Insufficient documentation

## 2017-10-17 DIAGNOSIS — F419 Anxiety disorder, unspecified: Secondary | ICD-10-CM | POA: Insufficient documentation

## 2017-10-23 DIAGNOSIS — R634 Abnormal weight loss: Secondary | ICD-10-CM | POA: Insufficient documentation

## 2017-11-23 DIAGNOSIS — G250 Essential tremor: Secondary | ICD-10-CM | POA: Insufficient documentation

## 2017-12-07 DIAGNOSIS — G629 Polyneuropathy, unspecified: Secondary | ICD-10-CM | POA: Insufficient documentation

## 2017-12-18 ENCOUNTER — Ambulatory Visit (INDEPENDENT_AMBULATORY_CARE_PROVIDER_SITE_OTHER): Payer: Medicare Other | Admitting: Vascular Surgery

## 2017-12-18 ENCOUNTER — Encounter (INDEPENDENT_AMBULATORY_CARE_PROVIDER_SITE_OTHER): Payer: Self-pay | Admitting: Vascular Surgery

## 2017-12-18 VITALS — BP 147/57 | HR 64 | Resp 16 | Ht 70.0 in | Wt 146.0 lb

## 2017-12-18 DIAGNOSIS — I739 Peripheral vascular disease, unspecified: Secondary | ICD-10-CM | POA: Diagnosis not present

## 2017-12-18 DIAGNOSIS — I6523 Occlusion and stenosis of bilateral carotid arteries: Secondary | ICD-10-CM | POA: Diagnosis not present

## 2017-12-18 DIAGNOSIS — I639 Cerebral infarction, unspecified: Secondary | ICD-10-CM | POA: Diagnosis not present

## 2017-12-18 DIAGNOSIS — I10 Essential (primary) hypertension: Secondary | ICD-10-CM | POA: Diagnosis not present

## 2017-12-18 NOTE — Assessment & Plan Note (Signed)
The patient is undergone right carotid artery stent placement for recurrent stenosis.  He has at least moderate left carotid artery stenosis.  This is not been checked in a few months now, and I would recommend this be assessed with duplex in the near future at his convenience.  He has markedly improved from his posterior circulation stroke and hopefully will continue to improve over the next several months.  Continue current medical regimen including aspirin, Plavix, and a statin agent.

## 2017-12-18 NOTE — Assessment & Plan Note (Signed)
Has not been checked in some time and does appear to be significantly symptomatic.  Would recommend noninvasive studies to be performed at his convenience.  The pathophysiology and natural history of peripheral arterial disease were discussed with the patient in detail.

## 2017-12-18 NOTE — Assessment & Plan Note (Signed)
blood pressure control important in reducing the progression of atherosclerotic disease. On appropriate oral medications.  

## 2017-12-18 NOTE — Assessment & Plan Note (Signed)
Markedly improved.  Sees neurology.

## 2017-12-18 NOTE — Progress Notes (Signed)
MRN : [id number]  Scott Larsen is a 77 y.o. (1941-10-21) male who presents with chief complaint of  Chief Complaint  Patient presents with  . Follow-up    Carotid artery stenosis  .  History of Present Illness: Patient returns in follow-up.  About 4-5 months ago, he underwent a right carotid artery stent placement and had a posterior circulation stroke.  Although his recovery has been prolonged, he has had near complete resolution.  He has had a couple of falls, but other than that well without any obvious weakness.  He reports no fevers or chills.  He denies any chest pain or shortness of breath.  He is having some cramping and pain in his legs.  Both legs are affected and he has a previous history of known PAD.  No new ulceration.  No rest pain.  Current Outpatient Medications  Medication Sig Dispense Refill  . acetaminophen (TYLENOL) 500 MG tablet Take 500 mg by mouth every 6 (six) hours as needed for mild pain or headache.    Marland Kitchen aspirin EC 325 MG tablet Take 325 mg by mouth daily.     . busPIRone (BUSPAR) 7.5 MG tablet Take by mouth.    . clopidogrel (PLAVIX) 75 MG tablet Take 75 mg by mouth daily.    Marland Kitchen FLUoxetine (PROZAC) 10 MG capsule Take by mouth.    . gabapentin (NEURONTIN) 100 MG capsule Take 100 mg twice a day for one week, then increase to 200 mg(2 tablets) twice a day and continue    . metoprolol tartrate (LOPRESSOR) 25 MG tablet Take 25 mg by mouth 2 (two) times daily.     . nicotine (NICODERM CQ - DOSED IN MG/24 HOURS) 21 mg/24hr patch Place 21 mg onto the skin daily.    . simvastatin (ZOCOR) 80 MG tablet Take 80 mg by mouth daily at 6 PM.    . traZODone (DESYREL) 50 MG tablet Take by mouth.     No current facility-administered medications for this visit.     Past Medical History:  Diagnosis Date  . Cancer ([place])   . Coronary artery disease   . History of kidney stones    40 years ago  . Hypertension   . Myocardial infarction [facility])     Past Surgical  History:  Procedure Laterality Date  . APPENDECTOMY    . CAROTID ENDARTERECTOMY Right   . CAROTID PTA/STENT INTERVENTION Right 08/09/2017   Procedure: Carotid PTA/Stent Intervention;  Surgeon: Algernon Huxley, MD;  Location: [place] [facility];  Service: Cardiovascular;  Laterality: Right;  . CORONARY ARTERY BYPASS GRAFT     triple bypass  . TONSILLECTOMY      Social History Social History   Tobacco Use  . Smoking status: Current Every Day Smoker  . Smokeless tobacco: Never Used  Substance Use Topics  . Alcohol use: Yes    Comment: 3 beers per day  . Drug use: No     Family History No bleeding or clotting disorders.  No autoimmune diseases.  No aneurysms.  No Known Allergies   REVIEW OF SYSTEMS (Negative unless checked)  Constitutional: [] Weight loss  [] Fever  [] Chills Cardiac: [] Chest pain   [] Chest pressure   [] Palpitations   [] Shortness of breath when laying flat   [] Shortness of breath at rest   [] Shortness of breath with exertion. Vascular:  [x] Pain in legs with walking   [] Pain in legs at rest   [] Pain in legs when laying flat   [  x]Claudication   [] Pain in feet when walking  [] Pain in feet at rest  [] Pain in feet when laying flat   [] History of DVT   [] Phlebitis   [] Swelling in legs   [] Varicose veins   [] Non-healing ulcers Pulmonary:   [] Uses home oxygen   [] Productive cough   [] Hemoptysis   [] Wheeze  [] COPD   [] Asthma Neurologic:  [] Dizziness  [] Blackouts   [] Seizures   [x] History of stroke   [] History of TIA  [] Aphasia   [] Temporary blindness   [] Dysphagia   [] Weakness or numbness in arms   [x] Weakness or numbness in legs Musculoskeletal:  [x] Arthritis   [] Joint swelling   [] Joint pain   [] Low back pain Hematologic:  [] Easy bruising  [] Easy bleeding   [] Hypercoagulable state   [] Anemic  [] Hepatitis Gastrointestinal:  [] Blood in stool   [] Vomiting blood  [] Gastroesophageal reflux/heartburn   [] Difficulty swallowing. Genitourinary:  [] Chronic kidney disease   [] Difficult  urination  [] Frequent urination  [] Burning with urination   [] Blood in urine Skin:  [] Rashes   [] Ulcers   [] Wounds Psychological:  [] History of anxiety   []  History of major depression.  Physical Examination  Vitals:   12/18/17 1547  BP: (!) 147/57  Pulse: 64  Resp: 16  Weight: 66.2 kg (146 lb)  Height: 5\' 10"  (1.778 m)   Body mass index is 20.95 kg/m. Gen:  WD/WN, NAD.   Head: [place]/AT, No temporalis wasting. Ear/Nose/Throat: Hearing grossly intact, nares w/o erythema or drainage, trachea midline Eyes: Conjunctiva clear. Sclera non-icteric Neck: Supple.  Left carotid bruit is present. Pulmonary:  Good air movement, equal and clear to auscultation bilaterally.  Cardiac: RRR, no JVD Vascular:  Vessel Right Left  Radial Palpable Palpable                          PT  1+ palpable  trace palpable  DP  1+ palpable  1+ palpable    Musculoskeletal: M/S 5/5 throughout.  No deformity or atrophy.  No edema. Neurologic: CN 2-12 intact. Sensation grossly intact in extremities.  Symmetrical.  Speech is fluent. Motor exam as listed above. Psychiatric: Judgment intact, Mood & affect appropriate for pt's clinical situation. Dermatologic: No rashes or ulcers noted.  No cellulitis or open wounds. Lymph : No Cervical, Axillary, or Inguinal lymphadenopathy.     CBC Lab Results  Component Value Date   WBC 7.8 08/17/2017   HGB 9.3 (L) 08/17/2017   HCT 26.9 (L) 08/17/2017   MCV 104.7 (H) 08/17/2017   PLT 268 08/17/2017    BMET    Component Value Date/Time   NA 145 08/16/2017 0606   K 3.7 08/16/2017 0606   CL 113 (H) 08/16/2017 0606   CO2 26 08/16/2017 0606   GLUCOSE 113 (H) 08/16/2017 0606   BUN 20 08/16/2017 0606   CREATININE 1.23 08/16/2017 0606   CREATININE 1.74 (H) 02/21/2012 0926   CALCIUM 7.9 (L) 08/16/2017 0606   GFRNONAA 56 (L) 08/16/2017 0606   GFRNONAA 42 (L) 02/21/2012 0926   GFRAA >60 08/16/2017 0606   GFRAA 50 (L) 02/21/2012 0926   CrCl cannot be calculated  (Patient's most recent lab result is older than the maximum 21 days allowed.).  COAG Lab Results  Component Value Date   INR 0.99 08/09/2017    Radiology No results found.   Assessment/Plan Acute CVA (cerebrovascular accident) ([place]) Markedly improved.  Sees neurology.  Hypertension blood pressure control important in reducing the progression of atherosclerotic disease.  On appropriate oral medications.   PAD (peripheral artery disease) ([place]) Has not been checked in some time and does appear to be significantly symptomatic.  Would recommend noninvasive studies to be performed at his convenience.  The pathophysiology and natural history of peripheral arterial disease were discussed with the patient in detail.  Carotid stenosis The patient is undergone right carotid artery stent placement for recurrent stenosis.  He has at least moderate left carotid artery stenosis.  This is not been checked in a few months now, and I would recommend this be assessed with duplex in the near future at his convenience.  He has markedly improved from his posterior circulation stroke and hopefully will continue to improve over the next several months.  Continue current medical regimen including aspirin, Plavix, and a statin agent.    Leotis Pain, MD  12/18/2017 4:28 PM    This note was created with Dragon medical transcription system.  Any errors from dictation are purely unintentional

## 2017-12-18 NOTE — Patient Instructions (Signed)

## 2018-02-05 ENCOUNTER — Ambulatory Visit (INDEPENDENT_AMBULATORY_CARE_PROVIDER_SITE_OTHER): Payer: Medicare Other

## 2018-02-05 ENCOUNTER — Encounter (INDEPENDENT_AMBULATORY_CARE_PROVIDER_SITE_OTHER): Payer: Self-pay | Admitting: Vascular Surgery

## 2018-02-05 ENCOUNTER — Ambulatory Visit (INDEPENDENT_AMBULATORY_CARE_PROVIDER_SITE_OTHER): Payer: Medicare Other | Admitting: Vascular Surgery

## 2018-02-05 VITALS — BP 123/75 | HR 75 | Resp 17 | Wt 149.0 lb

## 2018-02-05 DIAGNOSIS — I6523 Occlusion and stenosis of bilateral carotid arteries: Secondary | ICD-10-CM | POA: Diagnosis not present

## 2018-02-05 DIAGNOSIS — F17210 Nicotine dependence, cigarettes, uncomplicated: Secondary | ICD-10-CM | POA: Diagnosis not present

## 2018-02-05 DIAGNOSIS — I10 Essential (primary) hypertension: Secondary | ICD-10-CM

## 2018-02-05 DIAGNOSIS — I739 Peripheral vascular disease, unspecified: Secondary | ICD-10-CM | POA: Diagnosis not present

## 2018-02-05 DIAGNOSIS — F17200 Nicotine dependence, unspecified, uncomplicated: Secondary | ICD-10-CM

## 2018-02-05 NOTE — Progress Notes (Signed)
MRN : [id number]  Scott Larsen is a 77 y.o. (03/12/1941) male who presents with chief complaint of  Chief Complaint  Patient presents with  . Follow-up    1-75month carotid,abi  .  History of Present Illness: Patient returns in follow up of multiple issues.  No new complaints today.  Still smoking but says he wants to quit.  No rest pain or ulceration of the LE.  Mild claudication.  ABIs are 0.8 bilaterally which are stable.   Carotid duplex shows moderate left carotid artery stenosis in the 60-79% range.  The right carotid stent is widely patent. He has largely recovered from his previous stroke without any new symptoms.   Current Outpatient Medications  Medication Sig Dispense Refill  . acetaminophen (TYLENOL) 500 MG tablet Take 500 mg by mouth every 6 (six) hours as needed for mild pain or headache.    Marland Kitchen aspirin EC 325 MG tablet Take 325 mg by mouth daily.     . busPIRone (BUSPAR) 7.5 MG tablet Take by mouth.    . clopidogrel (PLAVIX) 75 MG tablet Take 75 mg by mouth daily.    Marland Kitchen FLUoxetine (PROZAC) 10 MG capsule Take by mouth.    . gabapentin (NEURONTIN) 100 MG capsule Take 100 mg twice a day for one week, then increase to 200 mg(2 tablets) twice a day and continue    . metoprolol tartrate (LOPRESSOR) 25 MG tablet Take 25 mg by mouth 2 (two) times daily.     . nicotine (NICODERM CQ - DOSED IN MG/24 HOURS) 21 mg/24hr patch Place 21 mg onto the skin daily.    . simvastatin (ZOCOR) 80 MG tablet Take 80 mg by mouth daily at 6 PM.    . traZODone (DESYREL) 50 MG tablet Take by mouth.     No current facility-administered medications for this visit.         Past Medical History:  Diagnosis Date  . Cancer ([place])   . Coronary artery disease   . History of kidney stones    40 years ago  . Hypertension   . Myocardial infarction [facility])          Past Surgical History:  Procedure Laterality Date  . APPENDECTOMY    . CAROTID ENDARTERECTOMY Right    . CAROTID PTA/STENT INTERVENTION Right 08/09/2017   Procedure: Carotid PTA/Stent Intervention;  Surgeon: Algernon Huxley, MD;  Location: [place] [facility];  Service: Cardiovascular;  Laterality: Right;  . CORONARY ARTERY BYPASS GRAFT     triple bypass  . TONSILLECTOMY      Social History Social History        Tobacco Use  . Smoking status: Current Every Day Smoker  . Smokeless tobacco: Never Used  Substance Use Topics  . Alcohol use: Yes    Comment: 3 beers per day  . Drug use: No     Family History No bleeding or clotting disorders.  No autoimmune diseases.  No aneurysms.  No Known Allergies   REVIEW OF SYSTEMS (Negative unless checked)  Constitutional: [] Weight loss  [] Fever  [] Chills Cardiac: [] Chest pain   [] Chest pressure   [] Palpitations   [] Shortness of breath when laying flat   [] Shortness of breath at rest   [] Shortness of breath with exertion. Vascular:  [x] Pain in legs with walking   [] Pain in legs at rest   [] Pain in legs when laying flat   [x] Claudication   [] Pain in feet when walking  [] Pain in feet at  rest  [] Pain in feet when laying flat   [] History of DVT   [] Phlebitis   [] Swelling in legs   [] Varicose veins   [] Non-healing ulcers Pulmonary:   [] Uses home oxygen   [] Productive cough   [] Hemoptysis   [] Wheeze  [] COPD   [] Asthma Neurologic:  [] Dizziness  [] Blackouts   [] Seizures   [x] History of stroke   [] History of TIA  [] Aphasia   [] Temporary blindness   [] Dysphagia   [] Weakness or numbness in arms   [x] Weakness or numbness in legs Musculoskeletal:  [x] Arthritis   [] Joint swelling   [] Joint pain   [] Low back pain Hematologic:  [] Easy bruising  [] Easy bleeding   [] Hypercoagulable state   [] Anemic  [] Hepatitis Gastrointestinal:  [] Blood in stool   [] Vomiting blood  [] Gastroesophageal reflux/heartburn   [] Difficulty swallowing. Genitourinary:  [] Chronic kidney disease   [] Difficult urination  [] Frequent urination  [] Burning with urination    [] Blood in urine Skin:  [] Rashes   [] Ulcers   [] Wounds Psychological:  [] History of anxiety   []  History of major depression.      Physical Examination  Vitals:   02/05/18 1504  BP: 123/75  Pulse: 75  Resp: 17  Weight: 67.6 kg (149 lb)   Body mass index is 21.38 kg/m. Gen:  WD/WN, NAD Head: [place]/AT, No temporalis wasting. Ear/Nose/Throat: Hearing grossly intact, nares w/o erythema or drainage, trachea midline Eyes: Conjunctiva clear. Sclera non-icteric Neck: Supple.  Bilateral bruits present Pulmonary:  Good air movement, equal and clear to auscultation bilaterally.  Cardiac: RRR, no JVD Vascular:  Vessel Right Left  Radial Palpable Palpable                          PT 1+ Palpable 1+ Palpable  DP 1+ Palpable 1+ Palpable    Musculoskeletal: M/S 5/5 throughout.  No deformity or atrophy. No edema. Neurologic: CN 2-12 intact. Sensation grossly intact in extremities.  Symmetrical.  Speech is fluent. Motor exam as listed above. Psychiatric: Judgment intact, Mood & affect appropriate for pt's clinical situation. Dermatologic: No rashes or ulcers noted.  No cellulitis or open wounds.      CBC Lab Results  Component Value Date   WBC 7.8 08/17/2017   HGB 9.3 (L) 08/17/2017   HCT 26.9 (L) 08/17/2017   MCV 104.7 (H) 08/17/2017   PLT 268 08/17/2017    BMET    Component Value Date/Time   NA 145 08/16/2017 0606   K 3.7 08/16/2017 0606   CL 113 (H) 08/16/2017 0606   CO2 26 08/16/2017 0606   GLUCOSE 113 (H) 08/16/2017 0606   BUN 20 08/16/2017 0606   CREATININE 1.23 08/16/2017 0606   CREATININE 1.74 (H) 02/21/2012 0926   CALCIUM 7.9 (L) 08/16/2017 0606   GFRNONAA 56 (L) 08/16/2017 0606   GFRNONAA 42 (L) 02/21/2012 0926   GFRAA >60 08/16/2017 0606   GFRAA 50 (L) 02/21/2012 0926   CrCl cannot be calculated (Patient's most recent lab result is older than the maximum 21 days allowed.).  COAG Lab Results  Component Value Date   INR 0.99 08/09/2017     Radiology No results found.    Assessment/Plan Acute CVA (cerebrovascular accident) ([place]) Markedly improved.  Sees neurology.  Hypertension blood pressure control important in reducing the progression of atherosclerotic disease. On appropriate oral medications.   PAD (peripheral artery disease) ([facility]) ABIs are 0.8 bilaterally which are stable.  No intervention at current.  Plan smoking cessation and increased activity.  Recheck in one year   Tobacco use disorder We had a discussion for approximately 3-4 minutes regarding the absolute need for smoking cessation due to the deleterious nature of tobacco on the vascular system. We discussed the tobacco use would diminish patency of any intervention, and likely significantly worsen progressio of disease. We discussed multiple agents for quitting including replacement therapy or medications to reduce cravings such as Chantix. The patient voices their understanding of the importance of smoking cessation. Rx for chantix given today.     Carotid stenosis Carotid duplex shows moderate left carotid artery stenosis in the 60-79% range.  The right carotid stent is widely patent. Continue Plavix and Zocor. Recheck in 6 months    Leotis Pain, MD  02/06/2018 1:00 PM    This note was created with Dragon medical transcription system.  Any errors from dictation are purely unintentional

## 2018-02-06 NOTE — Assessment & Plan Note (Signed)
Carotid duplex shows moderate left carotid artery stenosis in the 60-79% range.  The right carotid stent is widely patent. Continue Plavix and Zocor. Recheck in 6 months

## 2018-02-06 NOTE — Assessment & Plan Note (Signed)
We had a discussion for approximately 3-4 minutes regarding the absolute need for smoking cessation due to the deleterious nature of tobacco on the vascular system. We discussed the tobacco use would diminish patency of any intervention, and likely significantly worsen progressio of disease. We discussed multiple agents for quitting including replacement therapy or medications to reduce cravings such as Chantix. The patient voices their understanding of the importance of smoking cessation. Rx for chantix given today.

## 2018-06-21 ENCOUNTER — Encounter (INDEPENDENT_AMBULATORY_CARE_PROVIDER_SITE_OTHER): Payer: Medicare Other

## 2018-06-21 ENCOUNTER — Ambulatory Visit (INDEPENDENT_AMBULATORY_CARE_PROVIDER_SITE_OTHER): Payer: Medicare Other | Admitting: Vascular Surgery

## 2018-08-13 ENCOUNTER — Ambulatory Visit (INDEPENDENT_AMBULATORY_CARE_PROVIDER_SITE_OTHER): Payer: Medicare Other

## 2018-08-13 ENCOUNTER — Encounter (INDEPENDENT_AMBULATORY_CARE_PROVIDER_SITE_OTHER): Payer: Self-pay | Admitting: Vascular Surgery

## 2018-08-13 ENCOUNTER — Ambulatory Visit (INDEPENDENT_AMBULATORY_CARE_PROVIDER_SITE_OTHER): Payer: Medicare Other | Admitting: Vascular Surgery

## 2018-08-13 ENCOUNTER — Other Ambulatory Visit: Payer: Self-pay

## 2018-08-13 VITALS — BP 143/77 | HR 56 | Ht 70.0 in | Wt 153.0 lb

## 2018-08-13 DIAGNOSIS — F17210 Nicotine dependence, cigarettes, uncomplicated: Secondary | ICD-10-CM | POA: Diagnosis not present

## 2018-08-13 DIAGNOSIS — I6523 Occlusion and stenosis of bilateral carotid arteries: Secondary | ICD-10-CM | POA: Diagnosis not present

## 2018-08-13 DIAGNOSIS — I739 Peripheral vascular disease, unspecified: Secondary | ICD-10-CM | POA: Diagnosis not present

## 2018-08-13 DIAGNOSIS — I10 Essential (primary) hypertension: Secondary | ICD-10-CM | POA: Diagnosis not present

## 2018-08-13 DIAGNOSIS — F17200 Nicotine dependence, unspecified, uncomplicated: Secondary | ICD-10-CM

## 2018-08-13 NOTE — Progress Notes (Signed)
MRN : [id number]  Scott Larsen is a 77 y.o. (January 13, 1941) male who presents with chief complaint of No chief complaint on file. Marland Kitchen  History of Present Illness: Patient returns in follow-up of his carotid disease.  He is doing well without any changes or problems since his last visit.  He still has some occasional balance issues and memory disturbances from his previous stroke, but no focal neurologic symptoms since his last visit. Carotid duplex revealed a patent right carotid artery stent and stable 60 to 79% left ICA stenosis without significant progression from his previous study.  Current Outpatient Medications  Medication Sig Dispense Refill  . acetaminophen (TYLENOL) 500 MG tablet Take 500 mg by mouth every 6 (six) hours as needed for mild pain or headache.    Marland Kitchen aspirin EC 325 MG tablet Take 325 mg by mouth daily.     . busPIRone (BUSPAR) 7.5 MG tablet Take by mouth.    . clopidogrel (PLAVIX) 75 MG tablet Take 75 mg by mouth daily.    Marland Kitchen FLUoxetine (PROZAC) 10 MG capsule Take by mouth.    . gabapentin (NEURONTIN) 100 MG capsule Take 100 mg twice a day for one week, then increase to 200 mg(2 tablets) twice a day and continue    . metoprolol tartrate (LOPRESSOR) 25 MG tablet Take 25 mg by mouth 2 (two) times daily.     . nicotine (NICODERM CQ - DOSED IN MG/24 HOURS) 21 mg/24hr patch Place 21 mg onto the skin daily.    . simvastatin (ZOCOR) 80 MG tablet Take 80 mg by mouth daily at 6 PM.    . traZODone (DESYREL) 50 MG tablet Take by mouth.     No current facility-administered medications for this visit.     Past Medical History:  Diagnosis Date  . Cancer ([place])   . Coronary artery disease   . History of kidney stones    40 years ago  . Hypertension   . Myocardial infarction [facility])     Past Surgical History:  Procedure Laterality Date  . APPENDECTOMY    . CAROTID ENDARTERECTOMY Right   . CAROTID PTA/STENT INTERVENTION Right 08/09/2017   Procedure: Carotid PTA/Stent  Intervention;  Surgeon: Algernon Huxley, MD;  Location: [place] [facility];  Service: Cardiovascular;  Laterality: Right;  . CORONARY ARTERY BYPASS GRAFT     triple bypass  . TONSILLECTOMY     Social History        Tobacco Use  . Smoking status: Current Every Day Smoker  . Smokeless tobacco: Never Used  Substance Use Topics  . Alcohol use: Yes    Comment: 3 beers per day  . Drug use: No     Family History No bleeding or clotting disorders.No autoimmune diseases. No aneurysms.  No Known Allergies   REVIEW OF SYSTEMS(Negative unless checked)  Constitutional: [] Weight loss[] Fever[] Chills Cardiac:[] Chest pain[] Chest pressure[] Palpitations [] Shortness of breath when laying flat [] Shortness of breath at rest [] Shortness of breath with exertion. Vascular: [x] Pain in legs with walking[] Pain in legsat rest[] Pain in legs when laying flat [x] Claudication [] Pain in feet when walking [] Pain in feet at rest [] Pain in feet when laying flat [] History of DVT [] Phlebitis [] Swelling in legs [] Varicose veins [] Non-healing ulcers Pulmonary: [] Uses home oxygen [] Productive cough[] Hemoptysis [] Wheeze [] COPD [] Asthma Neurologic: [] Dizziness [] Blackouts [] Seizures [x] History of stroke [] History of TIA[] Aphasia [] Temporary blindness[] Dysphagia [] Weaknessor numbness in arms [x] Weakness or numbnessin legs Musculoskeletal: [x] Arthritis [] Joint swelling [] Joint pain [] Low back pain Hematologic:[] Easy bruising[] Easy bleeding [] Hypercoagulable state [] Anemic [] Hepatitis Gastrointestinal:[] Blood  in stool[] Vomiting blood[] Gastroesophageal reflux/heartburn[] Difficulty swallowing. Genitourinary: [] Chronic kidney disease [] Difficulturination [] Frequenturination [] Burning with urination[] Blood in urine Skin: [] Rashes [] Ulcers [] Wounds Psychological: [] History of anxiety[] History of  major depression.    Physical Examination  There were no vitals filed for this visit. There is no height or weight on file to calculate BMI. Gen:  WD/WN, NAD Head: [place]/AT, No temporalis wasting. Ear/Nose/Throat: Hearing grossly intact, nares w/o erythema or drainage, trachea midline Eyes: Conjunctiva clear. Sclera non-icteric Neck: Supple.  Left carotid bruit present Pulmonary:  Good air movement, equal and clear to auscultation bilaterally.  Cardiac: RRR, No JVD Vascular:  Vessel Right Left  Radial Palpable Palpable                          PT  1+ palpable  1+ palpable  DP  1+ palpable  1+ palpable   Musculoskeletal: M/S 5/5 throughout.  No deformity or atrophy.  No significant lower extremity edema. Neurologic: CN 2-12 intact. Sensation grossly intact in extremities.  Symmetrical.  Speech is fluent. Motor exam as listed above. Psychiatric: Judgment intact, Mood & affect appropriate for pt's clinical situation. Dermatologic: No rashes or ulcers noted.  No cellulitis or open wounds.      CBC Lab Results  Component Value Date   WBC 7.8 08/17/2017   HGB 9.3 (L) 08/17/2017   HCT 26.9 (L) 08/17/2017   MCV 104.7 (H) 08/17/2017   PLT 268 08/17/2017    BMET    Component Value Date/Time   NA 145 08/16/2017 0606   K 3.7 08/16/2017 0606   CL 113 (H) 08/16/2017 0606   CO2 26 08/16/2017 0606   GLUCOSE 113 (H) 08/16/2017 0606   BUN 20 08/16/2017 0606   CREATININE 1.23 08/16/2017 0606   CREATININE 1.74 (H) 02/21/2012 0926   CALCIUM 7.9 (L) 08/16/2017 0606   GFRNONAA 56 (L) 08/16/2017 0606   GFRNONAA 42 (L) 02/21/2012 0926   GFRAA >60 08/16/2017 0606   GFRAA 50 (L) 02/21/2012 0926   CrCl cannot be calculated (Patient's most recent lab result is older than the maximum 21 days allowed.).  COAG Lab Results  Component Value Date   INR 0.99 08/09/2017    Radiology No results found.   Assessment/Plan Hypertension blood pressure control important in reducing  the progression of atherosclerotic disease. On appropriate oral medications.   PAD (peripheral artery disease) ([facility]) ABIs were 0.8 bilaterally which are stable earlier this year.  No intervention at current.  Plan smoking cessation and increased activity.  checking annually   Tobacco use disorder We had a discussion for approximately 3-4 minutes regarding the absolute need for smoking cessation due to the deleterious nature of tobacco on the vascular system. We discussed the tobacco use would diminish patency of any intervention, and likely significantly worsen progressio of disease. We discussed multiple agents for quitting including replacement therapy or medications to reduce cravings such as Chantix. The patient voices their understanding of the importance of smoking cessation. Rx for chantix given previously but he never took it.  He is going to consider taking it     Carotid stenosis Carotid duplex shows moderate left carotid artery stenosis in the 60-79% range.  The right carotid stent is widely patent. Continue Plavix and Zocor. Recheck in 6 months.  Refill for Plavix given today   Leotis Pain, MD  08/13/2018 2:51 PM    This note was created with Dragon medical transcription system.  Any errors from dictation are purely unintentional

## 2019-02-07 ENCOUNTER — Ambulatory Visit (INDEPENDENT_AMBULATORY_CARE_PROVIDER_SITE_OTHER): Payer: Medicare HMO | Admitting: Vascular Surgery

## 2019-02-07 ENCOUNTER — Ambulatory Visit (INDEPENDENT_AMBULATORY_CARE_PROVIDER_SITE_OTHER): Payer: Medicare HMO

## 2019-02-07 ENCOUNTER — Encounter (INDEPENDENT_AMBULATORY_CARE_PROVIDER_SITE_OTHER): Payer: Self-pay | Admitting: Vascular Surgery

## 2019-02-07 ENCOUNTER — Other Ambulatory Visit: Payer: Self-pay

## 2019-02-07 VITALS — BP 154/78 | HR 85 | Resp 12 | Ht 70.0 in | Wt 158.0 lb

## 2019-02-07 DIAGNOSIS — I10 Essential (primary) hypertension: Secondary | ICD-10-CM | POA: Diagnosis not present

## 2019-02-07 DIAGNOSIS — Z7902 Long term (current) use of antithrombotics/antiplatelets: Secondary | ICD-10-CM | POA: Diagnosis not present

## 2019-02-07 DIAGNOSIS — I6523 Occlusion and stenosis of bilateral carotid arteries: Secondary | ICD-10-CM | POA: Diagnosis not present

## 2019-02-07 DIAGNOSIS — I739 Peripheral vascular disease, unspecified: Secondary | ICD-10-CM

## 2019-02-07 DIAGNOSIS — F17200 Nicotine dependence, unspecified, uncomplicated: Secondary | ICD-10-CM | POA: Diagnosis not present

## 2019-02-07 NOTE — Progress Notes (Signed)
MRN : [id number]  Scott Larsen is a 78 y.o. ([birth year]) male who presents with chief complaint of  Chief Complaint  Patient presents with  . Follow-up  .  History of Present Illness: Patient returns today in follow up of multiple vascular issues.  He is doing well with no major issues today.  He still has some occasional balance issues from his previous stroke but no focal neurologic symptoms recently.  His carotid duplex today shows a widely patent right carotid stent and stable 60 to 79% left ICA stenosis without progression from his previous study.  He is also studied today for his peripheral arterial disease. ABIs today have dropped some on the left down to 0.59.  The right ABI stable at 0.83.  Mild claudication symptoms at current without limb threatening rest pain, ulceration, or infection.  Current Outpatient Medications  Medication Sig Dispense Refill  . aspirin EC 325 MG tablet Take 325 mg by mouth daily.     . clopidogrel (PLAVIX) 75 MG tablet Take 75 mg by mouth daily.    Marland Kitchen gabapentin (NEURONTIN) 100 MG capsule Take 100 mg twice a day for one week, then increase to 200 mg(2 tablets) twice a day and continue    . lisinopril (PRINIVIL,ZESTRIL) 5 MG tablet Take 5 mg by mouth daily.    . metoprolol tartrate (LOPRESSOR) 25 MG tablet Take 25 mg by mouth 2 (two) times daily.     . simvastatin (ZOCOR) 80 MG tablet Take 80 mg by mouth daily at 6 PM.    . traZODone (DESYREL) 50 MG tablet Take by mouth.    Marland Kitchen acetaminophen (TYLENOL) 500 MG tablet Take 500 mg by mouth every 6 (six) hours as needed for mild pain or headache.    . busPIRone (BUSPAR) 7.5 MG tablet Take by mouth.    Marland Kitchen FLUoxetine (PROZAC) 10 MG capsule Take by mouth.    . nicotine (NICODERM CQ - DOSED IN MG/24 HOURS) 21 mg/24hr patch Place 21 mg onto the skin daily.     No current facility-administered medications for this visit.     Past Medical History:  Diagnosis Date  . Cancer ([place])   . Coronary artery disease    . History of kidney stones    40 years ago  . Hypertension   . Myocardial infarction [facility])     Past Surgical History:  Procedure Laterality Date  . APPENDECTOMY    . CAROTID ENDARTERECTOMY Right   . CAROTID PTA/STENT INTERVENTION Right 08/09/2017   Procedure: Carotid PTA/Stent Intervention;  Surgeon: Algernon Huxley, MD;  Location: [place] [facility];  Service: Cardiovascular;  Laterality: Right;  . CORONARY ARTERY BYPASS GRAFT     triple bypass  . TONSILLECTOMY     Social History        Tobacco Use  . Smoking status: Current Every Day Smoker  . Smokeless tobacco: Never Used  Substance Use Topics  . Alcohol use: Yes    Comment: 3 beers per day  . Drug use: No     Family History No bleeding or clotting disorders.No autoimmune diseases. No aneurysms.  No Known Allergies   REVIEW OF SYSTEMS(Negative unless checked)  Constitutional: [] ?Weight loss[] ?Fever[] ?Chills Cardiac:[] ?Chest pain[] ?Chest pressure[] ?Palpitations [] ?Shortness of breath when laying flat [] ?Shortness of breath at rest [] ?Shortness of breath with exertion. Vascular: [x] ?Pain in legs with walking[] ?Pain in legsat rest[] ?Pain in legs when laying flat [x] ?Claudication [] ?Pain in feet when walking [] ?Pain in feet at rest [] ?Pain in feet when laying  flat [] ?History of DVT [] ?Phlebitis [] ?Swelling in legs [] ?Varicose veins [] ?Non-healing ulcers Pulmonary: [] ?Uses home oxygen [] ?Productive cough[] ?Hemoptysis [] ?Wheeze [] ?COPD [] ?Asthma Neurologic: [] ?Dizziness [] ?Blackouts [] ?Seizures [x] ?History of stroke [] ?History of TIA[] ?Aphasia [] ?Temporary blindness[] ?Dysphagia [] ?Weaknessor numbness in arms [x] ?Weakness or numbnessin legs Musculoskeletal: [x] ?Arthritis [] ?Joint swelling [] ?Joint pain [] ?Low back pain Hematologic:[] ?Easy bruising[] ?Easy bleeding [] ?Hypercoagulable state [] ?Anemic [] ?Hepatitis  Gastrointestinal:[] ?Blood in stool[] ?Vomiting blood[] ?Gastroesophageal reflux/heartburn[] ?Difficulty swallowing. Genitourinary: [] ?Chronic kidney disease [] ?Difficulturination [] ?Frequenturination [] ?Burning with urination[] ?Blood in urine Skin: [] ?Rashes [] ?Ulcers [] ?Wounds Psychological: [] ?History of anxiety[] ?History of major depression.      Physical Examination  BP (!) 154/78 (BP Location: Left Arm, Patient Position: Sitting, Cuff Size: Small)   Pulse 85   Resp 12   Ht 5\' 10"  (1.778 m)   Wt 158 lb (71.7 kg)   BMI 22.67 kg/m  Gen:  WD/WN, NAD Head: [place]/AT, No temporalis wasting. Ear/Nose/Throat: Hearing grossly intact, nares w/o erythema or drainage Eyes: Conjunctiva clear. Sclera non-icteric Neck: Supple.  Trachea midline.  Soft left carotid bruit Pulmonary:  Good air movement, no use of accessory muscles.  Cardiac: RRR, no JVD Vascular:  Vessel Right Left  Radial Palpable Palpable                          PT  1+ palpable  1+ palpable  DP  1+ palpable  not palpable    Musculoskeletal: M/S 5/5 throughout.  No deformity or atrophy.  No edema. Neurologic: Sensation grossly intact in extremities.  Symmetrical.  Speech is fluent.  Psychiatric: Judgment intact, Mood & affect appropriate for pt's clinical situation. Dermatologic: No rashes or ulcers noted.  No cellulitis or open wounds.       Labs No results found for this or any previous visit (from the past 2160 hour(s)).  Radiology No results found.  Assessment/Plan Hypertension blood pressure control important in reducing the progression of atherosclerotic disease. On appropriate oral medications.  Carotid stenosis His carotid duplex today shows a widely patent right carotid stent and stable 60 to 79% left ICA stenosis without progression from his previous study. No changes currently.  Continue current medical regimen.  Recheck in 6 months  PAD (peripheral artery  disease) ([place]) ABIs today have dropped some on the left down to 0.59.  The right ABI stable at 0.83.  He has no current lifestyle limiting symptoms or limb threatening symptoms.  No intervention at this time.  Plan to recheck in 6 months.    Leotis Pain, MD  02/07/2019 4:17 PM    This note was created with Dragon medical transcription system.  Any errors from dictation are purely unintentional

## 2019-02-07 NOTE — Assessment & Plan Note (Signed)
ABIs today have dropped some on the left down to 0.59.  The right ABI stable at 0.83.  He has no current lifestyle limiting symptoms or limb threatening symptoms.  No intervention at this time.  Plan to recheck in 6 months.

## 2019-02-07 NOTE — Assessment & Plan Note (Signed)
His carotid duplex today shows a widely patent right carotid stent and stable 60 to 79% left ICA stenosis without progression from his previous study. No changes currently.  Continue current medical regimen.  Recheck in 6 months

## 2019-02-07 NOTE — Patient Instructions (Signed)

## 2019-02-11 ENCOUNTER — Encounter (INDEPENDENT_AMBULATORY_CARE_PROVIDER_SITE_OTHER): Payer: Medicare Other

## 2019-02-11 ENCOUNTER — Ambulatory Visit (INDEPENDENT_AMBULATORY_CARE_PROVIDER_SITE_OTHER): Payer: Medicare Other | Admitting: Vascular Surgery

## 2019-04-08 DIAGNOSIS — N183 Chronic kidney disease, stage 3 (moderate): Secondary | ICD-10-CM | POA: Diagnosis not present

## 2019-04-08 DIAGNOSIS — I255 Ischemic cardiomyopathy: Secondary | ICD-10-CM | POA: Diagnosis not present

## 2019-04-08 DIAGNOSIS — E7800 Pure hypercholesterolemia, unspecified: Secondary | ICD-10-CM | POA: Diagnosis not present

## 2019-04-08 DIAGNOSIS — F17200 Nicotine dependence, unspecified, uncomplicated: Secondary | ICD-10-CM | POA: Diagnosis not present

## 2019-04-08 DIAGNOSIS — I639 Cerebral infarction, unspecified: Secondary | ICD-10-CM | POA: Diagnosis not present

## 2019-04-08 DIAGNOSIS — Z951 Presence of aortocoronary bypass graft: Secondary | ICD-10-CM | POA: Diagnosis not present

## 2019-04-08 DIAGNOSIS — G250 Essential tremor: Secondary | ICD-10-CM | POA: Diagnosis not present

## 2019-04-08 DIAGNOSIS — I6521 Occlusion and stenosis of right carotid artery: Secondary | ICD-10-CM | POA: Diagnosis not present

## 2019-04-08 DIAGNOSIS — I739 Peripheral vascular disease, unspecified: Secondary | ICD-10-CM | POA: Diagnosis not present

## 2019-06-23 DIAGNOSIS — G250 Essential tremor: Secondary | ICD-10-CM | POA: Diagnosis not present

## 2019-06-23 DIAGNOSIS — I639 Cerebral infarction, unspecified: Secondary | ICD-10-CM | POA: Diagnosis not present

## 2019-06-23 DIAGNOSIS — Z720 Tobacco use: Secondary | ICD-10-CM | POA: Diagnosis not present

## 2019-07-24 DIAGNOSIS — Z23 Encounter for immunization: Secondary | ICD-10-CM | POA: Diagnosis not present

## 2019-07-24 DIAGNOSIS — Z0000 Encounter for general adult medical examination without abnormal findings: Secondary | ICD-10-CM | POA: Diagnosis not present

## 2019-07-24 DIAGNOSIS — I739 Peripheral vascular disease, unspecified: Secondary | ICD-10-CM | POA: Diagnosis not present

## 2019-07-24 DIAGNOSIS — E7800 Pure hypercholesterolemia, unspecified: Secondary | ICD-10-CM | POA: Diagnosis not present

## 2019-07-24 DIAGNOSIS — N183 Chronic kidney disease, stage 3 (moderate): Secondary | ICD-10-CM | POA: Diagnosis not present

## 2019-07-24 DIAGNOSIS — I714 Abdominal aortic aneurysm, without rupture: Secondary | ICD-10-CM | POA: Diagnosis not present

## 2019-07-24 DIAGNOSIS — G250 Essential tremor: Secondary | ICD-10-CM | POA: Diagnosis not present

## 2019-07-24 DIAGNOSIS — F419 Anxiety disorder, unspecified: Secondary | ICD-10-CM | POA: Diagnosis not present

## 2019-07-24 DIAGNOSIS — I255 Ischemic cardiomyopathy: Secondary | ICD-10-CM | POA: Diagnosis not present

## 2019-07-25 DIAGNOSIS — E7800 Pure hypercholesterolemia, unspecified: Secondary | ICD-10-CM | POA: Diagnosis not present

## 2019-07-29 ENCOUNTER — Other Ambulatory Visit: Payer: Self-pay | Admitting: Physical Medicine and Rehabilitation

## 2019-07-29 DIAGNOSIS — M5416 Radiculopathy, lumbar region: Secondary | ICD-10-CM

## 2019-07-29 DIAGNOSIS — G8929 Other chronic pain: Secondary | ICD-10-CM | POA: Diagnosis not present

## 2019-07-29 DIAGNOSIS — M5442 Lumbago with sciatica, left side: Secondary | ICD-10-CM | POA: Diagnosis not present

## 2019-07-29 DIAGNOSIS — M545 Low back pain: Secondary | ICD-10-CM | POA: Diagnosis not present

## 2019-07-29 DIAGNOSIS — M5136 Other intervertebral disc degeneration, lumbar region: Secondary | ICD-10-CM | POA: Diagnosis not present

## 2019-07-29 DIAGNOSIS — M5441 Lumbago with sciatica, right side: Secondary | ICD-10-CM | POA: Diagnosis not present

## 2019-08-09 ENCOUNTER — Other Ambulatory Visit: Payer: Self-pay

## 2019-08-09 ENCOUNTER — Ambulatory Visit
Admission: RE | Admit: 2019-08-09 | Discharge: 2019-08-09 | Disposition: A | Discharge status: Home / Self Care | Payer: Medicare HMO | Source: Ambulatory Visit | Attending: Physical Medicine and Rehabilitation | Admitting: Physical Medicine and Rehabilitation

## 2019-08-09 DIAGNOSIS — M545 Low back pain: Secondary | ICD-10-CM | POA: Diagnosis not present

## 2019-08-09 DIAGNOSIS — M5416 Radiculopathy, lumbar region: Secondary | ICD-10-CM | POA: Insufficient documentation

## 2019-08-12 ENCOUNTER — Ambulatory Visit (INDEPENDENT_AMBULATORY_CARE_PROVIDER_SITE_OTHER): Payer: Medicare HMO | Admitting: Vascular Surgery

## 2019-08-12 ENCOUNTER — Encounter (INDEPENDENT_AMBULATORY_CARE_PROVIDER_SITE_OTHER): Payer: Self-pay | Admitting: Vascular Surgery

## 2019-08-12 ENCOUNTER — Other Ambulatory Visit: Payer: Self-pay

## 2019-08-12 ENCOUNTER — Ambulatory Visit (INDEPENDENT_AMBULATORY_CARE_PROVIDER_SITE_OTHER): Payer: Medicare HMO

## 2019-08-12 VITALS — BP 73/49 | HR 87 | Resp 16 | Wt 150.6 lb

## 2019-08-12 DIAGNOSIS — I714 Abdominal aortic aneurysm, without rupture, unspecified: Secondary | ICD-10-CM

## 2019-08-12 DIAGNOSIS — I10 Essential (primary) hypertension: Secondary | ICD-10-CM

## 2019-08-12 DIAGNOSIS — I739 Peripheral vascular disease, unspecified: Secondary | ICD-10-CM | POA: Diagnosis not present

## 2019-08-12 DIAGNOSIS — I70213 Atherosclerosis of native arteries of extremities with intermittent claudication, bilateral legs: Secondary | ICD-10-CM

## 2019-08-12 DIAGNOSIS — E785 Hyperlipidemia, unspecified: Secondary | ICD-10-CM

## 2019-08-12 DIAGNOSIS — F17200 Nicotine dependence, unspecified, uncomplicated: Secondary | ICD-10-CM

## 2019-08-12 DIAGNOSIS — I6523 Occlusion and stenosis of bilateral carotid arteries: Secondary | ICD-10-CM

## 2019-08-12 DIAGNOSIS — I70219 Atherosclerosis of native arteries of extremities with intermittent claudication, unspecified extremity: Secondary | ICD-10-CM | POA: Insufficient documentation

## 2019-08-12 NOTE — Patient Instructions (Signed)
Peripheral Vascular Disease  Peripheral vascular disease (PVD) is a disease of the blood vessels that are not part of your heart and brain. A simple term for PVD is poor circulation. In most cases, PVD narrows the blood vessels that carry blood from your heart to the rest of your body. This can reduce the supply of blood to your arms, legs, and internal organs, like your stomach or kidneys. However, PVD most often affects a person's lower legs and feet. Without treatment, PVD tends to get worse. PVD can also lead to acute ischemic limb. This is when an arm or leg suddenly cannot get enough blood. This is a medical emergency. Follow these instructions at home: Lifestyle  Do not use any products that contain nicotine or tobacco, such as cigarettes and e-cigarettes. If you need help quitting, ask your doctor.  Lose weight if you are overweight. Or, stay at a healthy weight as told by your doctor.  Eat a diet that is low in fat and cholesterol. If you need help, ask your doctor.  Exercise regularly. Ask your doctor for activities that are right for you. General instructions  Take over-the-counter and prescription medicines only as told by your doctor.  Take good care of your feet: ? Wear comfortable shoes that fit well. ? Check your feet often for any cuts or sores.  Keep all follow-up visits as told by your doctor This is important. Contact a doctor if:  You have cramps in your legs when you walk.  You have leg pain when you are at rest.  You have coldness in a leg or foot.  Your skin changes.  You are unable to get or have an erection (erectile dysfunction).  You have cuts or sores on your feet that do not heal. Get help right away if:  Your arm or leg turns cold, numb, and blue.  Your arms or legs become red, warm, swollen, painful, or numb.  You have chest pain.  You have trouble breathing.  You suddenly have weakness in your face, arm, or leg.  You become very  confused or you cannot speak.  You suddenly have a very bad headache.  You suddenly cannot see. Summary  Peripheral vascular disease (PVD) is a disease of the blood vessels.  A simple term for PVD is poor circulation. Without treatment, PVD tends to get worse.  Treatment may include exercise, low fat and low cholesterol diet, and quitting smoking. This information is not intended to replace advice given to you by your health care provider. Make sure you discuss any questions you have with your health care provider. Document Released: 02/07/2010 Document Revised: 10/26/2017 Document Reviewed: 12/21/2016 Elsevier Patient Education  2020 [employer]  

## 2019-08-12 NOTE — Assessment & Plan Note (Signed)
blood pressure control important in reducing the progression of atherosclerotic disease. On appropriate oral medications.  

## 2019-08-12 NOTE — Assessment & Plan Note (Signed)
His duplex today shows progression of his carotid artery velocities now into the 60 to 79% range on the right with narrowing just above the previously placed stent.  His left carotid artery stenosis is in the 40 to 59% range. This is worrisome given the fact that he is already had a recurrence.  We have strongly advised him to stop smoking.  I will keep a short interval follow-up of 6 months to monitor the situation.  The velocities are certainly not high enough to suggest high-grade stenosis requiring treatment at this point, but this will need to be monitored fairly closely.

## 2019-08-12 NOTE — Assessment & Plan Note (Signed)
We had a discussion for approximately 3-4 minutes regarding the absolute need for smoking cessation due to the deleterious nature of tobacco on the vascular system. We discussed the tobacco use would diminish patency of any intervention, and likely significantly worsen progressio of disease. We discussed multiple agents for quitting including replacement therapy or medications to reduce cravings such as Chantix. The patient voices their understanding of the importance of smoking cessation.  

## 2019-08-12 NOTE — Assessment & Plan Note (Signed)
The patient has worsening claudication symptoms in both lower extremities.  His ABIs have dropped and are now 0.7 on the right and 0.51 on the left.  His left leg is the more severely affected the 2 legs.  We discussed options today including proceeding with angiography with possible intervention versus a short interval follow-up of 3 months.  He would prefer the latter.  This is certainly reasonable as he does not have any immediate limb threatening symptoms.  We will see him back at that time.

## 2019-08-12 NOTE — Progress Notes (Signed)
MRN : [id number]  Scott Larsen is a 78 y.o. ([birth year]) male who presents with chief complaint of  Chief Complaint  Patient presents with   Follow-up    ultrasound follow up  .  History of Present Illness: Patient returns today in follow up of multiple vascular issues.  He continues to smoke.  He was recently told he had an aneurysm.  He underwent an MRI study which suggested an aneurysm approaching 4 cm in maximal diameter.  This had not been checked in some time.  He does not have any obvious aneurysm related symptoms.  His biggest complaint at current is worsening leg pain with activity.  This is worse in the left leg than the right.  No open ulcerations or infection.  The pain does not wake him at night. The patient has worsening claudication symptoms in both lower extremities.  His ABIs have dropped and are now 0.7 on the right and 0.51 on the left. We also studied his carotids today.  He is status post a right carotid endarterectomy almost a decade ago and a right carotid stent placement about 2 years ago.  He has largely recovered from his previous stroke as well.  His duplex today shows progression of his carotid artery velocities now into the 60 to 79% range on the right with narrowing just above the previously placed stent.  His left carotid artery stenosis is in the 40 to 59% range.  He has not had any focal neurologic symptoms since his last visit.  Current Outpatient Medications  Medication Sig Dispense Refill   acetaminophen (TYLENOL) 500 MG tablet Take 500 mg by mouth every 6 (six) hours as needed for mild pain or headache.     aspirin EC 81 MG tablet Take 81 mg by mouth daily.      busPIRone (BUSPAR) 7.5 MG tablet Take by mouth.     clopidogrel (PLAVIX) 75 MG tablet Take 75 mg by mouth daily.     FLUoxetine (PROZAC) 10 MG capsule Take by mouth.     gabapentin (NEURONTIN) 100 MG capsule Take 100 mg twice a day for one week, then increase to 200 mg(2 tablets) twice  a day and continue     lisinopril (PRINIVIL,ZESTRIL) 5 MG tablet Take 5 mg by mouth daily.     metoprolol tartrate (LOPRESSOR) 25 MG tablet Take 25 mg by mouth 2 (two) times daily.      simvastatin (ZOCOR) 80 MG tablet Take 80 mg by mouth daily at 6 PM.     traZODone (DESYREL) 50 MG tablet Take by mouth.     nicotine (NICODERM CQ - DOSED IN MG/24 HOURS) 21 mg/24hr patch Place 21 mg onto the skin daily.     No current facility-administered medications for this visit.     Past Medical History:  Diagnosis Date   Cancer [facility]    Coronary artery disease    History of kidney stones    40 years ago   Hypertension    Myocardial infarction [facility])     Past Surgical History:  Procedure Laterality Date   APPENDECTOMY     CAROTID ENDARTERECTOMY Right    CAROTID PTA/STENT INTERVENTION Right 08/09/2017   Procedure: Carotid PTA/Stent Intervention;  Surgeon: Algernon Huxley, MD;  Location: [place] [facility];  Service: Cardiovascular;  Laterality: Right;   CORONARY ARTERY BYPASS GRAFT     triple bypass   TONSILLECTOMY      Social History Social History   Tobacco  Use   Smoking status: Current Every Day Smoker   Smokeless tobacco: Never Used  Substance Use Topics   Alcohol use: Yes    Comment: 3 beers per day   Drug use: No     Family History Family History  Problem Relation Age of Onset   Heart disease Brother   No bleeding disorders, clotting disorders, aneurysms, or autoimmune diseases  No Known Allergies   REVIEW OF SYSTEMS (Negative unless checked)  Constitutional: [] Weight loss  [] Fever  [] Chills Cardiac: [] Chest pain   [] Chest pressure   [] Palpitations   [] Shortness of breath when laying flat   [] Shortness of breath at rest   [] Shortness of breath with exertion. Vascular:  [x] Pain in legs with walking   [x] Pain in legs at rest   [] Pain in legs when laying flat   [x] Claudication   [] Pain in feet when walking  [] Pain in feet at rest  [] Pain in feet when  laying flat   [] History of DVT   [] Phlebitis   [] Swelling in legs   [] Varicose veins   [] Non-healing ulcers Pulmonary:   [] Uses home oxygen   [] Productive cough   [] Hemoptysis   [] Wheeze  [] COPD   [] Asthma Neurologic:  [] Dizziness  [] Blackouts   [] Seizures   [x] History of stroke   [x] History of TIA  [] Aphasia   [] Temporary blindness   [] Dysphagia   [] Weakness or numbness in arms   [] Weakness or numbness in legs Musculoskeletal:  [x] Arthritis   [] Joint swelling   [x] Joint pain   [] Low back pain Hematologic:  [] Easy bruising  [] Easy bleeding   [] Hypercoagulable state   [] Anemic   Gastrointestinal:  [] Blood in stool   [] Vomiting blood  [] Gastroesophageal reflux/heartburn   [] Abdominal pain Genitourinary:  [x] Chronic kidney disease   [] Difficult urination  [] Frequent urination  [] Burning with urination   [] Hematuria Skin:  [] Rashes   [] Ulcers   [] Wounds Psychological:  [] History of anxiety   []  History of major depression.  Physical Examination  BP (!) 73/49 (BP Location: Left Arm)    Pulse 87    Resp 16    Wt 150 lb 9.6 oz (68.3 kg)    BMI 21.61 kg/m  Gen:  WD/WN, NAD Head: /AT, No temporalis wasting. Ear/Nose/Throat: Hearing grossly intact, nares w/o erythema or drainage Eyes: Conjunctiva clear. Sclera non-icteric Neck: Supple.  Trachea midline Pulmonary:  Good air movement, no use of accessory muscles.  Cardiac: RRR, no JVD Vascular:  Vessel Right Left  Radial Palpable Palpable                          PT  1+ palpable  not palpable  DP  1+ palpable  1+ palpable   Gastrointestinal: soft, non-tender/non-distended.  Mildly increased aortic impulse Musculoskeletal: M/S 5/5 throughout.  No deformity or atrophy.  No edema. Neurologic: Sensation grossly intact in extremities.  Symmetrical.  Speech is fluent.  Psychiatric: Judgment intact, Mood & affect appropriate for pt's clinical situation. Dermatologic: No rashes or ulcers noted.  No cellulitis or open wounds.       Labs No  results found for this or any previous visit (from the past 2160 hour(s)).  Radiology Mr Lumbar Spine Wo Contrast  Result Date: 08/10/2019 CLINICAL DATA:  Low back pain over the last year. Bilateral leg pain with walking. EXAM: MRI LUMBAR SPINE WITHOUT CONTRAST TECHNIQUE: Multiplanar, multisequence MR imaging of the lumbar spine was performed. No intravenous contrast was administered. COMPARISON:  None. FINDINGS: Segmentation:  5 lumbar type vertebral bodies. Alignment:  Normal Vertebrae: Benign appearing hemangioma within the L2 vertebral body. Old minor superior endplate depression at 624THL. Conus medullaris and cauda equina: Conus extends to the L2-3 level. Conus and cauda equina appear normal. Paraspinal and other soft tissues: Aortic atherosclerosis with infrarenal abdominal aortic aneurysm approaching 4 cm. This was not studied in a complete fashion. Disc levels: T12-L1 and L1-2: Normal L2-3: Mild bulging of the disc. No compressive canal or foraminal stenosis. L3-4: Mild bulging of the disc.  No compressive stenosis. L4-5: Mild bulging of the disc.  No compressive stenosis. L5-S1: Mild bulging of the disc. No compressive stenosis. Mild facet osteoarthritis. IMPRESSION: Mild lumbar degenerative changes without compressive stenosis of the canal or foramina. 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 Atherosclerotic vascular disease with aneurysmal dilatation of the aorta. Infrarenal aneurysm approaching 4 cm. This was not completely evaluated. Electronically Signed   By: Nelson Chimes M.D.   On: 08/10/2019 14:17    Assessment/Plan  AAA (abdominal aortic aneurysm) without rupture (HCC) Seen on MRI. Approximately 4 cm. Recheck in 6 months  Hypertension blood pressure control important in reducing the progression of atherosclerotic disease. On appropriate oral medications.   Tobacco use disorder We had a discussion for approximately 3-4 minutes  regarding the absolute need for smoking cessation due to the deleterious nature of tobacco on the vascular system. We discussed the tobacco use would diminish patency of any intervention, and likely significantly worsen progressio of disease. We discussed multiple agents for quitting including replacement therapy or medications to reduce cravings such as Chantix. The patient voices their understanding of the importance of smoking cessation.   Hyperlipidemia lipid control important in reducing the progression of atherosclerotic disease. Continue statin therapy   Atherosclerosis of native arteries of extremity with intermittent claudication (HCC) The patient has worsening claudication symptoms in both lower extremities.  His ABIs have dropped and are now 0.7 on the right and 0.51 on the left.  His left leg is the more severely affected the 2 legs.  We discussed options today including proceeding with angiography with possible intervention versus a short interval follow-up of 3 months.  He would prefer the latter.  This is certainly reasonable as he does not have any immediate limb threatening symptoms.  We will see him back at that time.  Carotid stenosis His duplex today shows progression of his carotid artery velocities now into the 60 to 79% range on the right with narrowing just above the previously placed stent.  His left carotid artery stenosis is in the 40 to 59% range. This is worrisome given the fact that he is already had a recurrence.  We have strongly advised him to stop smoking.  I will keep a short interval follow-up of 6 months to monitor the situation.  The velocities are certainly not high enough to suggest high-grade stenosis requiring treatment at this point, but this will need to be monitored fairly closely.    Leotis Pain, MD  08/12/2019 4:07 PM    This note was created with Dragon medical transcription system.  Any errors from dictation are purely unintentional

## 2019-08-12 NOTE — Assessment & Plan Note (Signed)
lipid control important in reducing the progression of atherosclerotic disease. Continue statin therapy  

## 2019-08-12 NOTE — Assessment & Plan Note (Signed)
Seen on MRI. Approximately 4 cm. Recheck in 6 months

## 2019-09-11 ENCOUNTER — Inpatient Hospital Stay
Admission: EM | Admit: 2019-09-11 | Discharge: 2019-09-13 | DRG: 281 | Disposition: A | Discharge status: Home Health | Payer: Medicare HMO | Attending: Internal Medicine | Admitting: Internal Medicine

## 2019-09-11 ENCOUNTER — Emergency Department: Payer: Medicare HMO

## 2019-09-11 ENCOUNTER — Encounter
Admission: EM | Disposition: A | Discharge status: Home Health | Payer: Self-pay | Source: Home / Self Care | Attending: Internal Medicine

## 2019-09-11 ENCOUNTER — Other Ambulatory Visit: Payer: Self-pay

## 2019-09-11 DIAGNOSIS — Z8249 Family history of ischemic heart disease and other diseases of the circulatory system: Secondary | ICD-10-CM

## 2019-09-11 DIAGNOSIS — Z7902 Long term (current) use of antithrombotics/antiplatelets: Secondary | ICD-10-CM | POA: Diagnosis not present

## 2019-09-11 DIAGNOSIS — I214 Non-ST elevation (NSTEMI) myocardial infarction: Secondary | ICD-10-CM | POA: Diagnosis not present

## 2019-09-11 DIAGNOSIS — Z7982 Long term (current) use of aspirin: Secondary | ICD-10-CM | POA: Diagnosis not present

## 2019-09-11 DIAGNOSIS — Z951 Presence of aortocoronary bypass graft: Secondary | ICD-10-CM | POA: Diagnosis not present

## 2019-09-11 DIAGNOSIS — Z20828 Contact with and (suspected) exposure to other viral communicable diseases: Secondary | ICD-10-CM | POA: Diagnosis present

## 2019-09-11 DIAGNOSIS — I222 Subsequent non-ST elevation (NSTEMI) myocardial infarction: Secondary | ICD-10-CM | POA: Diagnosis not present

## 2019-09-11 DIAGNOSIS — F10239 Alcohol dependence with withdrawal, unspecified: Secondary | ICD-10-CM | POA: Diagnosis not present

## 2019-09-11 DIAGNOSIS — I70213 Atherosclerosis of native arteries of extremities with intermittent claudication, bilateral legs: Secondary | ICD-10-CM | POA: Diagnosis not present

## 2019-09-11 DIAGNOSIS — N183 Chronic kidney disease, stage 3 unspecified: Secondary | ICD-10-CM | POA: Diagnosis present

## 2019-09-11 DIAGNOSIS — I255 Ischemic cardiomyopathy: Secondary | ICD-10-CM | POA: Diagnosis present

## 2019-09-11 DIAGNOSIS — I447 Left bundle-branch block, unspecified: Secondary | ICD-10-CM | POA: Diagnosis present

## 2019-09-11 DIAGNOSIS — Z8673 Personal history of transient ischemic attack (TIA), and cerebral infarction without residual deficits: Secondary | ICD-10-CM

## 2019-09-11 DIAGNOSIS — R0989 Other specified symptoms and signs involving the circulatory and respiratory systems: Secondary | ICD-10-CM | POA: Diagnosis not present

## 2019-09-11 DIAGNOSIS — E785 Hyperlipidemia, unspecified: Secondary | ICD-10-CM | POA: Diagnosis present

## 2019-09-11 DIAGNOSIS — I129 Hypertensive chronic kidney disease with stage 1 through stage 4 chronic kidney disease, or unspecified chronic kidney disease: Secondary | ICD-10-CM | POA: Diagnosis not present

## 2019-09-11 DIAGNOSIS — I252 Old myocardial infarction: Secondary | ICD-10-CM

## 2019-09-11 DIAGNOSIS — I2510 Atherosclerotic heart disease of native coronary artery without angina pectoris: Secondary | ICD-10-CM | POA: Diagnosis not present

## 2019-09-11 DIAGNOSIS — F17210 Nicotine dependence, cigarettes, uncomplicated: Secondary | ICD-10-CM | POA: Diagnosis not present

## 2019-09-11 DIAGNOSIS — R000 Tachycardia, unspecified: Secondary | ICD-10-CM | POA: Diagnosis not present

## 2019-09-11 DIAGNOSIS — R079 Chest pain, unspecified: Secondary | ICD-10-CM | POA: Diagnosis not present

## 2019-09-11 DIAGNOSIS — I10 Essential (primary) hypertension: Secondary | ICD-10-CM | POA: Diagnosis not present

## 2019-09-11 HISTORY — PX: LEFT HEART CATH AND CORONARY ANGIOGRAPHY: CATH118249

## 2019-09-11 LAB — CBC WITH DIFFERENTIAL/PLATELET
Abs Immature Granulocytes: 0.05 10*3/uL (ref 0.00–0.07)
Basophils Absolute: 0.1 10*3/uL (ref 0.0–0.1)
Basophils Relative: 1 %
Eosinophils Absolute: 0.4 10*3/uL (ref 0.0–0.5)
Eosinophils Relative: 4 %
HCT: 37.5 % — ABNORMAL LOW (ref 39.0–52.0)
Hemoglobin: 12.6 g/dL — ABNORMAL LOW (ref 13.0–17.0)
Immature Granulocytes: 1 %
Lymphocytes Relative: 28 %
Lymphs Abs: 2.8 10*3/uL (ref 0.7–4.0)
MCH: 34.3 pg — ABNORMAL HIGH (ref 26.0–34.0)
MCHC: 33.6 g/dL (ref 30.0–36.0)
MCV: 102.2 fL — ABNORMAL HIGH (ref 80.0–100.0)
Monocytes Absolute: 0.9 10*3/uL (ref 0.1–1.0)
Monocytes Relative: 9 %
Neutro Abs: 5.8 10*3/uL (ref 1.7–7.7)
Neutrophils Relative %: 57 %
Platelets: 262 10*3/uL (ref 150–400)
RBC: 3.67 MIL/uL — ABNORMAL LOW (ref 4.22–5.81)
RDW: 12.5 % (ref 11.5–15.5)
WBC: 9.9 10*3/uL (ref 4.0–10.5)
nRBC: 0 % (ref 0.0–0.2)

## 2019-09-11 LAB — COMPREHENSIVE METABOLIC PANEL
ALT: 8 U/L (ref 0–44)
AST: 23 U/L (ref 15–41)
Albumin: 3.4 g/dL — ABNORMAL LOW (ref 3.5–5.0)
Alkaline Phosphatase: 60 U/L (ref 38–126)
Anion gap: 8 (ref 5–15)
BUN: 16 mg/dL (ref 8–23)
CO2: 28 mmol/L (ref 22–32)
Calcium: 10.3 mg/dL (ref 8.9–10.3)
Chloride: 104 mmol/L (ref 98–111)
Creatinine, Ser: 1.74 mg/dL — ABNORMAL HIGH (ref 0.61–1.24)
GFR calc Af Amer: 43 mL/min — ABNORMAL LOW (ref >60)
GFR calc non Af Amer: 37 mL/min — ABNORMAL LOW (ref >60)
Glucose, Bld: 131 mg/dL — ABNORMAL HIGH (ref 70–99)
Potassium: 3.9 mmol/L (ref 3.5–5.1)
Sodium: 140 mmol/L (ref 135–145)
Total Bilirubin: 0.6 mg/dL (ref 0.3–1.2)
Total Protein: 7.3 g/dL (ref 6.5–8.1)

## 2019-09-11 LAB — HEPARIN LEVEL (UNFRACTIONATED): Heparin Unfractionated: 0.46 IU/mL (ref 0.30–0.70)

## 2019-09-11 LAB — BASIC METABOLIC PANEL
Anion gap: 5 (ref 5–15)
BUN: 18 mg/dL (ref 8–23)
CO2: 24 mmol/L (ref 22–32)
Calcium: 9.3 mg/dL (ref 8.9–10.3)
Chloride: 108 mmol/L (ref 98–111)
Creatinine, Ser: 1.61 mg/dL — ABNORMAL HIGH (ref 0.61–1.24)
GFR calc Af Amer: 47 mL/min — ABNORMAL LOW (ref >60)
GFR calc non Af Amer: 41 mL/min — ABNORMAL LOW (ref >60)
Glucose, Bld: 108 mg/dL — ABNORMAL HIGH (ref 70–99)
Potassium: 4.2 mmol/L (ref 3.5–5.1)
Sodium: 137 mmol/L (ref 135–145)

## 2019-09-11 LAB — TROPONIN I (HIGH SENSITIVITY)
Troponin I (High Sensitivity): 697 ng/L (ref <18)
Troponin I (High Sensitivity): 1115 ng/L (ref <18)
Troponin I (High Sensitivity): 1260 ng/L (ref <18)
Troponin I (High Sensitivity): 1591 ng/L (ref <18)

## 2019-09-11 LAB — SARS CORONAVIRUS 2 BY RT PCR (HOSPITAL ORDER, PERFORMED IN ~~LOC~~ HOSPITAL LAB): SARS Coronavirus 2: NEGATIVE

## 2019-09-11 LAB — HEMOGLOBIN A1C
Hgb A1c MFr Bld: 5.6 % (ref 4.8–5.6)
Mean Plasma Glucose: 114.02 mg/dL

## 2019-09-11 LAB — PROTIME-INR
INR: 1 (ref 0.8–1.2)
Prothrombin Time: 12.6 seconds (ref 11.4–15.2)

## 2019-09-11 LAB — LIPASE, BLOOD: Lipase: 21 U/L (ref 11–51)

## 2019-09-11 LAB — APTT: aPTT: 30 seconds (ref 24–36)

## 2019-09-11 LAB — TSH: TSH: 2.004 u[IU]/mL (ref 0.350–4.500)

## 2019-09-11 LAB — MAGNESIUM: Magnesium: 2 mg/dL (ref 1.7–2.4)

## 2019-09-11 SURGERY — LEFT HEART CATH AND CORONARY ANGIOGRAPHY
Anesthesia: Moderate Sedation

## 2019-09-11 MED ORDER — METOPROLOL TARTRATE 25 MG PO TABS
25.0000 mg | ORAL_TABLET | Freq: Two times a day (BID) | ORAL | Status: DC
  Administered 2019-09-11: 25 mg via ORAL
  Filled 2019-09-11: qty 1

## 2019-09-11 MED ORDER — DOCUSATE SODIUM 100 MG PO CAPS
100.0000 mg | ORAL_CAPSULE | Freq: Two times a day (BID) | ORAL | Status: DC
  Administered 2019-09-11 – 2019-09-13 (×5): 100 mg via ORAL
  Filled 2019-09-11 (×5): qty 1

## 2019-09-11 MED ORDER — SODIUM CHLORIDE 0.9 % IV SOLN: INTRAVENOUS | Status: DC

## 2019-09-11 MED ORDER — FENTANYL CITRATE (PF) 100 MCG/2ML IJ SOLN
INTRAMUSCULAR | Status: DC | PRN
  Administered 2019-09-11 (×2): 25 ug via INTRAVENOUS

## 2019-09-11 MED ORDER — BUSPIRONE HCL 15 MG PO TABS
7.5000 mg | ORAL_TABLET | Freq: Two times a day (BID) | ORAL | Status: DC
  Administered 2019-09-11 – 2019-09-13 (×4): 7.5 mg via ORAL
  Filled 2019-09-11 (×7): qty 1

## 2019-09-11 MED ORDER — ASPIRIN EC 81 MG PO TBEC
81.0000 mg | DELAYED_RELEASE_TABLET | Freq: Every day | ORAL | Status: DC
  Administered 2019-09-11 – 2019-09-13 (×3): 81 mg via ORAL
  Filled 2019-09-11 (×3): qty 1

## 2019-09-11 MED ORDER — SODIUM CHLORIDE 0.9% FLUSH
3.0000 mL | Freq: Two times a day (BID) | INTRAVENOUS | Status: DC
  Administered 2019-09-12: 3 mL via INTRAVENOUS

## 2019-09-11 MED ORDER — LORAZEPAM 1 MG PO TABS
1.0000 mg | ORAL_TABLET | ORAL | Status: DC | PRN
  Administered 2019-09-12 (×2): 1 mg via ORAL
  Filled 2019-09-11 (×3): qty 1

## 2019-09-11 MED ORDER — METOPROLOL TARTRATE 50 MG PO TABS
50.0000 mg | ORAL_TABLET | Freq: Two times a day (BID) | ORAL | Status: DC
  Administered 2019-09-11 – 2019-09-13 (×3): 50 mg via ORAL
  Filled 2019-09-11 (×4): qty 1

## 2019-09-11 MED ORDER — LABETALOL HCL 5 MG/ML IV SOLN
5.0000 mg | INTRAVENOUS | Status: DC | PRN
  Administered 2019-09-11: 08:00:00 10 mg via INTRAVENOUS
  Filled 2019-09-11: qty 4

## 2019-09-11 MED ORDER — HEPARIN SODIUM (PORCINE) 5000 UNIT/ML IJ SOLN: 4000.0000 [IU] | Freq: Once | INTRAMUSCULAR | Status: DC

## 2019-09-11 MED ORDER — NICOTINE 21 MG/24HR TD PT24
21.0000 mg | MEDICATED_PATCH | Freq: Every day | TRANSDERMAL | Status: DC
  Administered 2019-09-11 – 2019-09-13 (×3): 21 mg via TRANSDERMAL
  Filled 2019-09-11 (×3): qty 1

## 2019-09-11 MED ORDER — HEPARIN (PORCINE) IN NACL 2000-0.9 UNIT/L-% IV SOLN
INTRAVENOUS | Status: DC | PRN
  Administered 2019-09-11: 500 mL

## 2019-09-11 MED ORDER — HEPARIN BOLUS VIA INFUSION
4000.0000 [IU] | Freq: Once | INTRAVENOUS | Status: AC
  Administered 2019-09-11: 06:00:00 4000 [IU] via INTRAVENOUS
  Filled 2019-09-11: qty 4000

## 2019-09-11 MED ORDER — VITAMIN B-1 100 MG PO TABS
100.0000 mg | ORAL_TABLET | Freq: Every day | ORAL | Status: DC
  Administered 2019-09-12 – 2019-09-13 (×2): 100 mg via ORAL
  Filled 2019-09-11 (×2): qty 1

## 2019-09-11 MED ORDER — MIDAZOLAM HCL 2 MG/2ML IJ SOLN
INTRAMUSCULAR | Status: AC
  Filled 2019-09-11: qty 2

## 2019-09-11 MED ORDER — ADULT MULTIVITAMIN W/MINERALS CH
1.0000 | ORAL_TABLET | Freq: Every day | ORAL | Status: DC
  Administered 2019-09-12 – 2019-09-13 (×2): 1 via ORAL
  Filled 2019-09-11 (×2): qty 1

## 2019-09-11 MED ORDER — ASPIRIN 81 MG PO CHEW: 81.0000 mg | CHEWABLE_TABLET | ORAL | Status: DC

## 2019-09-11 MED ORDER — ONDANSETRON HCL 4 MG/2ML IJ SOLN: 4.0000 mg | Freq: Four times a day (QID) | INTRAMUSCULAR | Status: DC | PRN

## 2019-09-11 MED ORDER — ACETAMINOPHEN 325 MG PO TABS: 650.0000 mg | ORAL_TABLET | ORAL | Status: DC | PRN

## 2019-09-11 MED ORDER — METOPROLOL TARTRATE 5 MG/5ML IV SOLN
INTRAVENOUS | Status: DC | PRN
  Administered 2019-09-11 (×2): 2.5 mg via INTRAVENOUS

## 2019-09-11 MED ORDER — METOPROLOL TARTRATE 5 MG/5ML IV SOLN
5.0000 mg | Freq: Once | INTRAVENOUS | Status: AC
  Administered 2019-09-11: 5 mg via INTRAVENOUS
  Filled 2019-09-11: qty 5

## 2019-09-11 MED ORDER — ACETAMINOPHEN 650 MG RE SUPP: 650.0000 mg | Freq: Four times a day (QID) | RECTAL | Status: DC | PRN

## 2019-09-11 MED ORDER — SODIUM CHLORIDE 0.9% FLUSH: 3.0000 mL | INTRAVENOUS | Status: DC | PRN

## 2019-09-11 MED ORDER — METOPROLOL TARTRATE 5 MG/5ML IV SOLN
INTRAVENOUS | Status: AC
  Filled 2019-09-11: qty 5

## 2019-09-11 MED ORDER — ACETAMINOPHEN 325 MG PO TABS
650.0000 mg | ORAL_TABLET | Freq: Four times a day (QID) | ORAL | Status: DC | PRN
  Administered 2019-09-12: 650 mg via ORAL
  Filled 2019-09-11: qty 2

## 2019-09-11 MED ORDER — HYDRALAZINE HCL 20 MG/ML IJ SOLN: 10.0000 mg | INTRAMUSCULAR | Status: AC | PRN

## 2019-09-11 MED ORDER — ATORVASTATIN CALCIUM 80 MG PO TABS
80.0000 mg | ORAL_TABLET | Freq: Every day | ORAL | Status: DC
  Administered 2019-09-12: 17:00:00 80 mg via ORAL
  Filled 2019-09-11 (×2): qty 1

## 2019-09-11 MED ORDER — THIAMINE HCL 100 MG/ML IJ SOLN
100.0000 mg | Freq: Every day | INTRAMUSCULAR | Status: DC
  Administered 2019-09-12: 09:00:00 100 mg via INTRAVENOUS
  Filled 2019-09-11: qty 2

## 2019-09-11 MED ORDER — NITROGLYCERIN 2 % TD OINT
1.0000 [in_us] | TOPICAL_OINTMENT | Freq: Once | TRANSDERMAL | Status: AC
  Administered 2019-09-11: 1 [in_us] via TOPICAL
  Filled 2019-09-11: qty 1

## 2019-09-11 MED ORDER — GABAPENTIN 100 MG PO CAPS
200.0000 mg | ORAL_CAPSULE | Freq: Two times a day (BID) | ORAL | Status: DC
  Administered 2019-09-11 – 2019-09-13 (×5): 200 mg via ORAL
  Filled 2019-09-11 (×5): qty 2

## 2019-09-11 MED ORDER — SODIUM CHLORIDE 0.9 % IV SOLN: 250.0000 mL | INTRAVENOUS | Status: DC | PRN

## 2019-09-11 MED ORDER — LABETALOL HCL 5 MG/ML IV SOLN
10.0000 mg | INTRAVENOUS | Status: AC | PRN
  Administered 2019-09-11: 19:00:00 10 mg via INTRAVENOUS
  Filled 2019-09-11: qty 4

## 2019-09-11 MED ORDER — FLUOXETINE HCL 10 MG PO CAPS
10.0000 mg | ORAL_CAPSULE | Freq: Every day | ORAL | Status: DC
  Administered 2019-09-11 – 2019-09-12 (×2): 10 mg via ORAL
  Filled 2019-09-11 (×4): qty 1

## 2019-09-11 MED ORDER — LISINOPRIL 5 MG PO TABS
5.0000 mg | ORAL_TABLET | Freq: Every day | ORAL | Status: DC
  Administered 2019-09-11 – 2019-09-13 (×3): 5 mg via ORAL
  Filled 2019-09-11 (×3): qty 1

## 2019-09-11 MED ORDER — FENTANYL CITRATE (PF) 100 MCG/2ML IJ SOLN
INTRAMUSCULAR | Status: AC
  Filled 2019-09-11: qty 2

## 2019-09-11 MED ORDER — SODIUM CHLORIDE 0.9% FLUSH
3.0000 mL | Freq: Two times a day (BID) | INTRAVENOUS | Status: DC
  Administered 2019-09-12 – 2019-09-13 (×3): 3 mL via INTRAVENOUS

## 2019-09-11 MED ORDER — FOLIC ACID 1 MG PO TABS
1.0000 mg | ORAL_TABLET | Freq: Every day | ORAL | Status: DC
  Administered 2019-09-12 – 2019-09-13 (×2): 1 mg via ORAL
  Filled 2019-09-11 (×2): qty 1

## 2019-09-11 MED ORDER — IOHEXOL 300 MG/ML  SOLN
INTRAMUSCULAR | Status: DC | PRN
  Administered 2019-09-11: 17:00:00 95 mL

## 2019-09-11 MED ORDER — ONDANSETRON HCL 4 MG PO TABS: 4.0000 mg | ORAL_TABLET | Freq: Four times a day (QID) | ORAL | Status: DC | PRN

## 2019-09-11 MED ORDER — ATORVASTATIN CALCIUM 20 MG PO TABS: 40.0000 mg | ORAL_TABLET | Freq: Every day | ORAL | Status: DC

## 2019-09-11 MED ORDER — MIDAZOLAM HCL 2 MG/2ML IJ SOLN
INTRAMUSCULAR | Status: DC | PRN
  Administered 2019-09-11 (×2): 1 mg via INTRAVENOUS

## 2019-09-11 MED ORDER — TRAZODONE HCL 50 MG PO TABS
50.0000 mg | ORAL_TABLET | Freq: Every day | ORAL | Status: DC
  Administered 2019-09-11 – 2019-09-12 (×2): 50 mg via ORAL
  Filled 2019-09-11 (×2): qty 1

## 2019-09-11 MED ORDER — HEPARIN (PORCINE) 25000 UT/250ML-% IV SOLN
850.0000 [IU]/h | INTRAVENOUS | Status: DC
  Administered 2019-09-11: 850 [IU]/h via INTRAVENOUS
  Filled 2019-09-11: qty 250

## 2019-09-11 MED ORDER — SODIUM CHLORIDE 0.9 % WEIGHT BASED INFUSION
1.0000 mL/kg/h | INTRAVENOUS | Status: AC
  Administered 2019-09-11: 1 mL/kg/h via INTRAVENOUS

## 2019-09-11 MED ORDER — HEPARIN (PORCINE) IN NACL 1000-0.9 UT/500ML-% IV SOLN
INTRAVENOUS | Status: AC
  Filled 2019-09-11: qty 1000

## 2019-09-11 MED ORDER — LORAZEPAM 2 MG/ML IJ SOLN: 1.0000 mg | INTRAMUSCULAR | Status: DC | PRN

## 2019-09-11 MED ORDER — CLOPIDOGREL BISULFATE 75 MG PO TABS
75.0000 mg | ORAL_TABLET | Freq: Every day | ORAL | Status: DC
  Administered 2019-09-11 – 2019-09-13 (×3): 75 mg via ORAL
  Filled 2019-09-11 (×3): qty 1

## 2019-09-11 MED ORDER — SODIUM CHLORIDE 0.9 % IV SOLN
INTRAVENOUS | Status: DC
  Administered 2019-09-11: 09:00:00 via INTRAVENOUS

## 2019-09-11 SURGICAL SUPPLY — 14 items
CATH ANGIO 5F JB2 100CM (CATHETERS) ×3 IMPLANT
CATH INFINITI 5 FR MPA2 (CATHETERS) ×3 IMPLANT
CATH INFINITI 5FR ANG PIGTAIL (CATHETERS) ×3 IMPLANT
CATH INFINITI 5FR JL4 (CATHETERS) ×3 IMPLANT
CATH INFINITI 5FR JL5 (CATHETERS) ×3 IMPLANT
CATH INFINITI JR4 5F (CATHETERS) ×3 IMPLANT
DEVICE CLOSURE MYNXGRIP 5F (Vascular Products) ×3 IMPLANT
KIT MANI 3VAL PERCEP (MISCELLANEOUS) ×3 IMPLANT
NEEDLE PERC 18GX7CM (NEEDLE) ×3 IMPLANT
PACK CARDIAC CATH (CUSTOM PROCEDURE TRAY) ×3 IMPLANT
SHEATH AVANTI 5FR X 11CM (SHEATH) ×3 IMPLANT
WIRE EMERALD 3MM-J .035X260CM (WIRE) ×3 IMPLANT
WIRE GUIDERIGHT .035X150 (WIRE) ×3 IMPLANT
WIRE HITORQ VERSACORE ST 145CM (WIRE) ×3 IMPLANT

## 2019-09-11 NOTE — ED Notes (Signed)
Date and time results received: 09/11/19 0557 (use smartphrase ".now" to insert current time)  Test: Troponin Critical Value: 697  Name of Provider Notified: Hinda Kehr, MD   Orders Received? Or Actions Taken?: no order at this time

## 2019-09-11 NOTE — ED Notes (Signed)
Pt given warm blankets.

## 2019-09-11 NOTE — H&P (Signed)
Scott Larsen is an 78 y.o. male.   Chief Complaint: Chest pain HPI: The patient with past medical history of coronary artery disease status post CABG, hypertension and tobacco abuse presents to the emergency department complaining of chest pain.  The patient reports his pain began at rest approximately 8 hours prior to presentation.  The patient took aspirin which relieved the chest pain at the time but began to have some nagging substernal chest pressure.  Laboratory evaluation in the emergency department was significant for troponin greater than 600.  The patient does not have any EKGs on file for comparison but had some nonspecific changes in V4.  Nitro paste was applied to his chest and the patient was started on a heparin drip prior to the emergency department staff calling the hospitalist service for admission.  Past Medical History:  Diagnosis Date  . Cancer ([place])   . Coronary artery disease   . History of kidney stones    40 years ago  . Hypertension   . Myocardial infarction [facility])     Past Surgical History:  Procedure Laterality Date  . APPENDECTOMY    . CAROTID ENDARTERECTOMY Right   . CAROTID PTA/STENT INTERVENTION Right 08/09/2017   Procedure: Carotid PTA/Stent Intervention;  Surgeon: Algernon Huxley, MD;  Location: [place] [facility];  Service: Cardiovascular;  Laterality: Right;  . CORONARY ARTERY BYPASS GRAFT     triple bypass  . TONSILLECTOMY      Family History  Problem Relation Age of Onset  . Heart disease Brother    Social History:  reports that he has been smoking. He has been smoking about 1.00 pack per day. He has never used smokeless tobacco. He reports current alcohol use. He reports that he does not use drugs.  Allergies: No Known Allergies  (Not in a hospital admission)   Results for orders placed or performed during the hospital encounter of 09/11/19 (from the past 48 hour(s))  SARS Coronavirus 2 by RT PCR (hospital order, performed in [facility]  hospital lab) Nasopharyngeal Nasopharyngeal Swab     Status: None   Collection Time: 09/11/19  5:16 AM   Specimen: Nasopharyngeal Swab  Result Value Ref Range   SARS Coronavirus 2 NEGATIVE NEGATIVE    Comment: (NOTE) If result is NEGATIVE SARS-CoV-2 target nucleic acids are NOT DETECTED. The SARS-CoV-2 RNA is generally detectable in upper and lower  respiratory specimens during the acute phase of infection. The lowest  concentration of SARS-CoV-2 viral copies this assay can detect is 250  copies / mL. A negative result does not preclude SARS-CoV-2 infection  and should not be used as the sole basis for treatment or other  patient management decisions.  A negative result may occur with  improper specimen collection / handling, submission of specimen other  than nasopharyngeal swab, presence of viral mutation(s) within the  areas targeted by this assay, and inadequate number of viral copies  (<250 copies / mL). A negative result must be combined with clinical  observations, patient history, and epidemiological information. If result is POSITIVE SARS-CoV-2 target nucleic acids are DETECTED. The SARS-CoV-2 RNA is generally detectable in upper and lower  respiratory specimens dur ing the acute phase of infection.  Positive  results are indicative of active infection with SARS-CoV-2.  Clinical  correlation with patient history and other diagnostic information is  necessary to determine patient infection status.  Positive results do  not rule out bacterial infection or co-infection with other viruses. If result  is PRESUMPTIVE POSTIVE SARS-CoV-2 nucleic acids MAY BE PRESENT.   A presumptive positive result was obtained on the submitted specimen  and confirmed on repeat testing.  While 2019 novel coronavirus  (SARS-CoV-2) nucleic acids may be present in the submitted sample  additional confirmatory testing may be necessary for epidemiological  and / or clinical management purposes  to  differentiate between  SARS-CoV-2 and other Sarbecovirus currently known to infect humans.  If clinically indicated additional testing with an alternate test  methodology [phone]) is advised. The SARS-CoV-2 RNA is generally  detectable in upper and lower respiratory sp ecimens during the acute  phase of infection. The expected result is Negative. Fact Sheet for Patients:  StrictlyIdeas.no Fact Sheet for Healthcare Providers: BankingDealers.co.za This test is not yet approved or cleared by the [place] FDA and has been authorized for detection and/or diagnosis of SARS-CoV-2 by FDA under an Emergency Use Authorization (EUA).  This EUA will remain in effect (meaning this test can be used) for the duration of the COVID-19 declaration under Section 564(b)(1) of the Act, 21 U.S.C. section 360bbb-3(b)(1), unless the authorization is terminated or revoked sooner. Performed at [facility], [address] [place], [place] [address]   Comprehensive metabolic panel     Status: Abnormal   Collection Time: 09/11/19  5:16 AM  Result Value Ref Range   Sodium 140 135 - 145 mmol/L   Potassium 3.9 3.5 - 5.1 mmol/L   Chloride 104 98 - 111 mmol/L   CO2 28 22 - 32 mmol/L   Glucose, Bld 131 (H) 70 - 99 mg/dL   BUN 16 8 - 23 mg/dL   Creatinine, Ser 1.74 (H) 0.61 - 1.24 mg/dL   Calcium 10.3 8.9 - 10.3 mg/dL   Total Protein 7.3 6.5 - 8.1 g/dL   Albumin 3.4 (L) 3.5 - 5.0 g/dL   AST 23 15 - 41 U/L   ALT 8 0 - 44 U/L   Alkaline Phosphatase 60 38 - 126 U/L   Total Bilirubin 0.6 0.3 - 1.2 mg/dL   GFR calc non Af Amer 37 (L) >60 mL/min   GFR calc Af Amer 43 (L) >60 mL/min   Anion gap 8 5 - 15    Comment: Performed at [facility], [address] [place], [place] [address]  Lipase, blood     Status: None   Collection Time: 09/11/19  5:16 AM  Result Value Ref Range   Lipase 21 11 - 51 U/L    Comment: Performed at [facility], [address] [place], [place] [address]  Troponin I (High Sensitivity)     Status: Abnormal   Collection Time: 09/11/19  5:16 AM  Result Value Ref Range   Troponin I (High Sensitivity) 697 (HH) <18 ng/L    Comment: CRITICAL RESULT CALLED TO, READ BACK BY AND VERIFIED WITH SHERIE ALLISON AT [id number] ON 09/11/19 [facility] (NOTE) Elevated high sensitivity troponin I (hsTnI) values and significant  changes across serial measurements may suggest ACS but many other  chronic and acute conditions are known to elevate hsTnI results.  Refer to the "Links" section for chest pain algorithms and additional  guidance. Performed at [facility], [address] [place], [place] [address]   CBC with Differential     Status: Abnormal   Collection Time: 09/11/19  5:16 AM  Result Value Ref Range   WBC 9.9 4.0 - 10.5 K/uL   RBC 3.67 (L) 4.22 - 5.81 MIL/uL  Hemoglobin 12.6 (L) 13.0 - 17.0 g/dL   HCT 37.5 (L) 39.0 - 52.0 %   MCV 102.2 (H) 80.0 - 100.0 fL   MCH 34.3 (H) 26.0 - 34.0 pg   MCHC 33.6 30.0 - 36.0 g/dL   RDW 12.5 11.5 - 15.5 %   Platelets 262 150 - 400 K/uL   nRBC 0.0 0.0 - 0.2 %   Neutrophils Relative % 57 %   Neutro Abs 5.8 1.7 - 7.7 K/uL   Lymphocytes Relative 28 %   Lymphs Abs 2.8 0.7 - 4.0 K/uL   Monocytes Relative 9 %   Monocytes Absolute 0.9 0.1 - 1.0 K/uL   Eosinophils Relative 4 %   Eosinophils Absolute 0.4 0.0 - 0.5 K/uL   Basophils Relative 1 %   Basophils Absolute 0.1 0.0 - 0.1 K/uL   Immature Granulocytes 1 %   Abs Immature Granulocytes 0.05 0.00 - 0.07 K/uL    Comment: Performed at [facility], [address] [place], [place] [address]  Protime-INR     Status: None   Collection Time: 09/11/19  5:16 AM  Result Value Ref Range   Prothrombin Time 12.6 11.4 - 15.2 seconds   INR 1.0 0.8 - 1.2    Comment: (NOTE) INR goal varies based on device and disease states. Performed at [facility], [address] [place], [place] [address]    Magnesium     Status: None   Collection Time: 09/11/19  5:16 AM  Result Value Ref Range   Magnesium 2.0 1.7 - 2.4 mg/dL    Comment: Performed at [facility], [address] [place], [place] [address]  APTT     Status: None   Collection Time: 09/11/19  5:16 AM  Result Value Ref Range   aPTT 30 24 - 36 seconds    Comment: Performed at [facility], [address] [place],  [address]   Dg Chest Portable 1 View  Result Date: 09/11/2019 CLINICAL DATA:  Chest pain EXAM: PORTABLE CHEST 1 VIEW COMPARISON:  Radiograph 08/13/2017, CT 09/22/2013 FINDINGS: Continued progression of the diffuse interstitial changes throughout the lungs. More patchy areas of opacity are seen in the right mid lung and periphery of the left lung. No pneumothorax or visible effusion. Postsurgical changes related to prior CABG including intact and aligned sternotomy wires and multiple surgical clips projecting over the mediastinum. Degenerative changes are present in the imaged spine and shoulders. IMPRESSION: Continued progression of the diffuse interstitial changes throughout the lungs. More patchy opacities in the mid lungs may reflect a superimposed consolidative process. Electronically Signed   By: Lovena Le M.D.   On: 09/11/2019 05:34    Review of Systems  Constitutional: Negative for chills and fever.  HENT: Negative for sore throat and tinnitus.   Eyes: Negative for blurred vision and redness.  Respiratory: Negative for cough and shortness of breath.   Cardiovascular: Positive for chest pain. Negative for palpitations, orthopnea and PND.  Gastrointestinal: Negative for abdominal pain, diarrhea, nausea and vomiting.  Genitourinary: Negative for dysuria, frequency and urgency.  Musculoskeletal: Negative for joint pain and myalgias.  Skin: Negative for rash.       No lesions  Neurological: Negative for speech change, focal weakness and weakness.  Endo/Heme/Allergies: Does not  bruise/bleed easily.       No temperature intolerance  Psychiatric/Behavioral: Negative for depression and suicidal ideas.    Blood pressure (!) 171/88, pulse 71, temperature 97.7 F (36.5 C), temperature source Oral,  resp. rate 13, height 5\' 9"  (1.753 m), weight 72.6 kg, SpO2 100 %. Physical Exam  Vitals reviewed. Constitutional: He is oriented to person, place, and time. He appears well-developed and well-nourished. No distress.  HENT:  Head: Normocephalic and atraumatic.  Mouth/Throat: Oropharynx is clear and moist.  Eyes: Pupils are equal, round, and reactive to light. Conjunctivae and EOM are normal. No scleral icterus.  Neck: Normal range of motion. Neck supple. No JVD present. No tracheal deviation present. No thyromegaly present.  Cardiovascular: Normal rate, regular rhythm and normal heart sounds. Exam reveals no gallop and no friction rub.  No murmur heard. Respiratory: Effort normal and breath sounds normal. No respiratory distress.  GI: Soft. Bowel sounds are normal. He exhibits no distension. There is no abdominal tenderness.  Genitourinary:    Genitourinary Comments: Deferred   Musculoskeletal: Normal range of motion.        General: No edema.  Lymphadenopathy:    He has no cervical adenopathy.  Neurological: He is alert and oriented to person, place, and time. No cranial nerve deficit.  Skin: Skin is warm and dry. No erythema.  Psychiatric: He has a normal mood and affect. His behavior is normal. Judgment and thought content normal.     Assessment/Plan This is a 78 year old male admitted for NSTEMI. 1.  NSTEMI: Continue heparin drip; the patient is n.p.o. in anticipation of heart catheterization.  Consult cardiology. 2.  Coronary artery disease: Unstable; continue aspirin and Plavix 3.  Hypertension: Controlled; continue metoprolol and lisinopril.  Labetalol as needed. 4.  CKD: Essentially baseline but acute on chronic since previous labs.  Hydrate with intravenous  fluids.  Avoid nephrotoxic agents. 5.  Hyperlipidemia: Continue statin therapy 6.  DVT prophylaxis: Therapeutic anticoagulation 7.  GI prophylaxis: None The patient is a full code.  Time spent on admission orders and patient care approximately 45 minutes  Harrie Foreman, MD 09/11/2019, 7:14 AM

## 2019-09-11 NOTE — ED Notes (Addendum)
MD made aware of pt BP, no orders at this time.

## 2019-09-11 NOTE — ED Triage Notes (Signed)
Per EMS, pt to the er for chest pressure last night at 2000, hx of triple bypas 2002, vss, no chest pain while on truck, 324mg  of ASA given, 20gauge left forearm, pulse 81, 98%, 139/93 BP, temp 98.2. Pt denies pain at this time but states that this is the same scenario as when he had his last heart attack.

## 2019-09-11 NOTE — Progress Notes (Signed)
ANTICOAGULATION CONSULT NOTE - Initial Consult  Pharmacy Consult for Heparin Indication: chest pain/ACS  No Known Allergies  Patient Measurements: Height: 5\' 9"  (175.3 cm) Weight: 160 lb (72.6 kg) IBW/kg (Calculated) : 70.7 HEPARIN DW (KG): 72.6  Vital Signs: Temp: 97.7 F (36.5 C) (10/15 0510) Temp Source: Oral (10/15 0510) BP: 196/105 (10/15 0540) Pulse Rate: 79 (10/15 0540)  Labs: Recent Labs    09/11/19 0516  HGB 12.6*  HCT 37.5*  PLT 262  APTT 30  LABPROT 12.6  INR 1.0  CREATININE 1.74*  TROPONINIHS 697*   Estimated Creatinine Clearance: 36.1 mL/min (A) (by C-G formula based on SCr of 1.74 mg/dL (H)).  Medical History: Past Medical History:  Diagnosis Date  . Cancer ([place])   . Coronary artery disease   . History of kidney stones    40 years ago  . Hypertension   . Myocardial infarction [facility])    Medications:  (Not in a hospital admission)  Assessment: Pharmacy asked to initiate and monitor Heparin for ACS.  Per PTA med list pt not on anticoagulants. Baseline labs ordered.  Goal of Therapy:  Heparin level 0.3-0.7 units/ml Monitor platelets by anticoagulation protocol: Yes   Plan:  Heparin bolus 4000 units x 1 then infusion at 850 units/hr Check HL 8 hours after heparin initiated.  Scott Larsen, Scott Larsen A 09/11/2019,6:07 AM

## 2019-09-11 NOTE — Consult Note (Signed)
Cardiology Consultation Note    Patient ID: ROPER HOEG, MRN: [id number], DOB/AGE: 1941-11-11 78 y.o. Admit date: 09/11/2019   Date of Consult: 09/11/2019 Primary Physician: Sofie Hartigan, MD Primary Cardiologist: Dr. Saralyn Pilar  Chief Complaint: chest pain Reason for Consultation: chest pain/nstemi Requesting MD: Dr. Anselm Jungling  HPI: Scott Larsen is a 78 y.o. male with history of coronary artery disease status post coronary artery bypass grafting x4 in 2007 at [facility] per Dr. Marnee Guarneri with a left internal mammary to the LAD, saphenous vein graft to the PL, saphenous vein graft OM1, saphenous vein graft to D1.  He also has a history of peripheral vascular disease status post carotid endarterectomies.  He is also status post right iliac stent.  He has significant disease in both lower extremities although has more claudication his left than right.  He has a mild cardiomyopathy EF 40 to 45% by echo in 2018.  He also had a CVA in 2018.  He continues to smoke heavily.  He is closely followed by vascular surgery.  He presents to the ER with chest pain somewhat less severe than his MI prior to his CABG however similar in nature and location.  Pain is relieved with nitrates and heparin.  He currently is hemodynamically stable and pain-free.  Chronic kidney disease with baseline serum creatinine of 1.6-1.7 recently.  He currently is on dual antiplatelet therapy with aspirin and clopidogrel.  He is on lisinopril at 5 mg daily metoprolol tartrate at 12.5 mg twice daily and simvastatin at 80 mg daily.  Past Medical History:  Diagnosis Date  . Cancer ([place])   . Coronary artery disease   . History of kidney stones    40 years ago  . Hypertension   . Myocardial infarction [facility])       Surgical History:  Past Surgical History:  Procedure Laterality Date  . APPENDECTOMY    . CAROTID ENDARTERECTOMY Right   . CAROTID PTA/STENT INTERVENTION Right 08/09/2017    Procedure: Carotid PTA/Stent Intervention;  Surgeon: Algernon Huxley, MD;  Location: [place] [facility];  Service: Cardiovascular;  Laterality: Right;  . CORONARY ARTERY BYPASS GRAFT     triple bypass  . TONSILLECTOMY       Home Meds: Prior to Admission medications   Medication Sig Start Date End Date Taking? Authorizing Provider  acetaminophen (TYLENOL) 500 MG tablet Take 500 mg by mouth every 6 (six) hours as needed for mild pain or headache.   Yes [provider]  aspirin EC 81 MG tablet Take 81 mg by mouth daily.    Yes [provider]  clopidogrel (PLAVIX) 75 MG tablet Take 75 mg by mouth daily. 07/10/17  Yes [provider]  gabapentin (NEURONTIN) 100 MG capsule Take 200 mg by mouth 2 (two) times daily.  11/23/17  Yes [provider]  lisinopril (PRINIVIL,ZESTRIL) 5 MG tablet Take 5 mg by mouth daily. 01/23/19  Yes [provider]  metoprolol tartrate (LOPRESSOR) 25 MG tablet Take 25 mg by mouth 2 (two) times daily.  06/09/17  Yes [provider]  simvastatin (ZOCOR) 80 MG tablet Take 80 mg by mouth daily at 6 PM.   Yes [provider]  traZODone (DESYREL) 50 MG tablet Take 50 mg by mouth at bedtime.  10/23/17  Yes [provider]  busPIRone (BUSPAR) 7.5 MG tablet Take by mouth. 10/23/17   [provider]  FLUoxetine (PROZAC) 10 MG capsule Take by mouth.  10/23/17   [provider]  nicotine (NICODERM CQ - DOSED IN MG/24 HOURS) 21 mg/24hr patch Place 21 mg onto the skin daily.    [provider]    Inpatient Medications:  . aspirin EC  81 mg Oral Daily  . atorvastatin  40 mg Oral q1800  . busPIRone  7.5 mg Oral BID  . clopidogrel  75 mg Oral Daily  . docusate sodium  100 mg Oral BID  . FLUoxetine  10 mg Oral QHS  . gabapentin  200 mg Oral BID  . lisinopril  5 mg Oral Daily  . metoprolol tartrate  25 mg Oral BID  . nicotine  21 mg Transdermal Daily  . sodium chloride flush  3 mL  Intravenous Q12H  . traZODone  50 mg Oral QHS   . sodium chloride    . heparin 850 Units/hr (09/11/19 0612)    Allergies: No Known Allergies  Social History   Socioeconomic History  . Marital status: Single    Spouse name: Not on file  . Number of children: Not on file  . Years of education: Not on file  . Highest education level: Not on file  Occupational History  . Not on file  Social Needs  . Financial resource strain: Not on file  . Food insecurity    Worry: Not on file    Inability: Not on file  . Transportation needs    Medical: Not on file    Non-medical: Not on file  Tobacco Use  . Smoking status: Current Every Day Smoker    Packs/day: 1.00  . Smokeless tobacco: Never Used  Substance and Sexual Activity  . Alcohol use: Yes    Comment: 3 beers per day  . Drug use: No  . Sexual activity: Not on file  Lifestyle  . Physical activity    Days per week: Not on file    Minutes per session: Not on file  . Stress: Not on file  Relationships  . Social Herbalist on phone: Not on file    Gets together: Not on file    Attends religious service: Not on file    Active member of club or organization: Not on file    Attends meetings of clubs or organizations: Not on file    Relationship status: Not on file  . Intimate partner violence    Fear of current or ex partner: Not on file    Emotionally abused: Not on file    Physically abused: Not on file    Forced sexual activity: Not on file  Other Topics Concern  . Not on file  Social History Narrative  . Not on file     Family History  Problem Relation Age of Onset  . Heart disease Brother      Review of Systems: A 12-system review of systems was performed and is negative except as noted in the HPI.  Labs: No results for input(s): CKTOTAL, CKMB, TROPONINI in the last 72 hours. Lab Results  Component Value Date   WBC 9.9 09/11/2019   HGB 12.6 (L) 09/11/2019   HCT 37.5 (L) 09/11/2019   MCV 102.2  (H) 09/11/2019   PLT 262 09/11/2019    Recent Labs  Lab 09/11/19 0516  NA 140  K 3.9  CL 104  CO2 28  BUN 16  CREATININE 1.74*  CALCIUM 10.3  PROT 7.3  BILITOT 0.6  ALKPHOS 60  ALT 8  AST 23  GLUCOSE 131*   No results found for: CHOL, HDL, LDLCALC, TRIG No results found for: DDIMER  Radiology/Studies:  Dg Chest Portable 1 View  Result Date: 09/11/2019 CLINICAL DATA:  Chest pain EXAM: PORTABLE CHEST 1 VIEW COMPARISON:  Radiograph 08/13/2017, CT 09/22/2013 FINDINGS: Continued progression of the diffuse interstitial changes throughout the lungs. More patchy areas of opacity are seen in the right mid lung and periphery of the left lung. No pneumothorax or visible effusion. Postsurgical changes related to prior CABG including intact and aligned sternotomy wires and multiple surgical clips projecting over the mediastinum. Degenerative changes are present in the imaged spine and shoulders. IMPRESSION: Continued progression of the diffuse interstitial changes throughout the lungs. More patchy opacities in the mid lungs may reflect a superimposed consolidative process. Electronically Signed   By: Lovena Le M.D.   On: 09/11/2019 05:34   Vas [place] Burnard Bunting With/wo Tbi  Result Date: 08/12/2019 LOWER EXTREMITY DOPPLER STUDY Indications: Peripheral artery disease, and Peripheral Arterial Disease. On              08/09/2017 Right carotid stent. Other Factors: On 04/05/2009 PTA of bilateral CIA's with right CIA stent.  Comparison Study: 02/07/2019 Performing Technologist: Concha Norway RVT  Examination Guidelines: A complete evaluation includes at minimum, Doppler waveform signals and systolic blood pressure reading at the level of bilateral brachial, anterior tibial, and posterior tibial arteries, when vessel segments are accessible. Bilateral testing is considered an integral part of a complete examination. Photoelectric Plethysmograph (PPG) waveforms and toe systolic pressure readings are included as  required and additional duplex testing as needed. Limited examinations for reoccurring indications may be performed as noted.  ABI Findings: +---------+------------------+-----+--------+--------+ Right    Rt Pressure (mmHg)IndexWaveformComment  +---------+------------------+-----+--------+--------+ Brachial 109                                     +---------+------------------+-----+--------+--------+ ATA      75                0.69 biphasic         +---------+------------------+-----+--------+--------+ PTA      79                0.72 biphasic         +---------+------------------+-----+--------+--------+ Great Toe71                0.65 Abnormal         +---------+------------------+-----+--------+--------+ +---------+------------------+-----+----------+-------+ Left     Lt Pressure (mmHg)IndexWaveform  Comment +---------+------------------+-----+----------+-------+ Brachial 73                                       +---------+------------------+-----+----------+-------+ ATA      55                0.50 monophasic        +---------+------------------+-----+----------+-------+ PTA      56                0.51 monophasic        +---------+------------------+-----+----------+-------+ Great Toe17                0.16 Abnormal          +---------+------------------+-----+----------+-------+ +-------+-----------+-----------+------------+------------+ ABI/TBIToday's ABIToday's TBIPrevious ABIPrevious TBI +-------+-----------+-----------+------------+------------+ Right  .72        .65        .  83                      +-------+-----------+-----------+------------+------------+ Left   .51        .16        .59                      +-------+-----------+-----------+------------+------------+ Bilateral ABIs appear decreased compared to prior study on 02/07/2019.  Summary: Right: Resting right ankle-brachial index indicates moderate right lower extremity  arterial disease. The right toe-brachial index is abnormal. Left: Resting left ankle-brachial index indicates moderate left lower extremity arterial disease. The left toe-brachial index is abnormal. Brachial pressure 67mmHg < than right.  *See table(s) above for measurements and observations.  Electronically signed by Leotis Pain MD on 08/12/2019 at 5:04:11 PM.    Final    Vas US Carotid  Result Date: 08/12/2019 Carotid Arterial Duplex Study Indications:       Right endarterectomy and Right stent. Other Factors:     05/25/10: Right carotid endarterectomy; 08/09/17: Right carotid                    stent. Comparison Study:  02/07/2019 Performing Technologist: Concha Norway RVT  Examination Guidelines: A complete evaluation includes B-mode imaging, spectral Doppler, color Doppler, and power Doppler as needed of all accessible portions of each vessel. Bilateral testing is considered an integral part of a complete examination. Limited examinations for reoccurring indications may be performed as noted.  Right Carotid Findings: +----------+--------+--------+--------+------------------+---------------------+           PSV cm/sEDV cm/sStenosisPlaque DescriptionComments              +----------+--------+--------+--------+------------------+---------------------+ CCA Prox  72      14                                                      +----------+--------+--------+--------+------------------+---------------------+ CCA Mid   121     15                                                      +----------+--------+--------+--------+------------------+---------------------+ CCA Distal96      13                                                      +----------+--------+--------+--------+------------------+---------------------+ ICA Prox  63      13                                                      +----------+--------+--------+--------+------------------+---------------------+ ICA Mid   53      15                                                       +----------+--------+--------+--------+------------------+---------------------+  ICA Distal247     65      60-79%                    narrowing in native                                                       vessel just distal to                                                     stent.                +----------+--------+--------+--------+------------------+---------------------+ ECA       84              >50%                                            +----------+--------+--------+--------+------------------+---------------------+ +----------+--------+-------+----------------+-------------------+           PSV cm/sEDV cmsDescribe        Arm Pressure (mmHG) +----------+--------+-------+----------------+-------------------+ Subclavian206            Multiphasic, WNL                    +----------+--------+-------+----------------+-------------------+ +---------+--------+--+--------+---------+ VertebralPSV cm/s67EDV cm/sAntegrade +---------+--------+--+--------+---------+  Left Carotid Findings: +----------+--------+--------+--------+----------------------+--------+           PSV cm/sEDV cm/sStenosisPlaque Description    Comments +----------+--------+--------+--------+----------------------+--------+ CCA Prox  128     19                                             +----------+--------+--------+--------+----------------------+--------+ CCA Mid   169     42                                             +----------+--------+--------+--------+----------------------+--------+ CCA Distal138     27                                             +----------+--------+--------+--------+----------------------+--------+ ICA Prox  166     40      40-59%  calcific and irregular         +----------+--------+--------+--------+----------------------+--------+ ICA Mid   150     37                                              +----------+--------+--------+--------+----------------------+--------+ ICA Distal94      33                                             +----------+--------+--------+--------+----------------------+--------+  ECA       301     21                                             +----------+--------+--------+--------+----------------------+--------+ +----------+--------+--------+----------------+-------------------+           PSV cm/sEDV cm/sDescribe        Arm Pressure (mmHG) +----------+--------+--------+----------------+-------------------+ [id number]              Blunted waveform                    +----------+--------+--------+----------------+-------------------+ +---------+--------+--+--------+----------+ VertebralPSV cm/s41EDV cm/sRetrograde +---------+--------+--+--------+----------+  Summary: Right Carotid: Velocities in the right ICA are consistent with a 60-79%                stenosis. Non-hemodynamically significant plaque <50% noted in                the CCA. The ECA appears <50% stenosed. Patent ICA stent. New                stenotic lesion just past the distal end of ICA stent with                velocities suggesting 60-79% stenosis. Left Carotid: Velocities in the left ICA are consistent with a 40-59% stenosis.               Non-hemodynamically significant plaque <50% noted in the CCA. The               ECA appears >50% stenosed. Vertebrals:  Right vertebral artery demonstrates antegrade flow. Left vertebral              artery demonstrates retrograde flow. Subclavians: Normal flow hemodynamics were seen in the right subclavian artery.              Left SCL shows blunted monophasic flow together with retrograde              vertebral artery suggest subclavian steal. *See table(s) above for measurements and observations.  Electronically signed by Leotis Pain MD on 08/12/2019 at 5:04:07 PM.    Final     Wt Readings from Last 3 Encounters:   09/11/19 67.4 kg  08/12/19 68.3 kg  02/07/19 71.7 kg    EKG: Sinus rhythm with T wave inversions in the inferolateral leads  Physical Exam:  Blood pressure (!) 178/80, pulse 72, temperature 97.6 F (36.4 C), temperature source Oral, resp. rate 16, height 5\' 10"  (1.778 m), weight 67.4 kg, SpO2 100 %. Body mass index is 21.34 kg/m. General: Well developed, well nourished, in no acute distress. Head: Normocephalic, atraumatic, sclera non-icteric, no xanthomas, nares are without discharge.  Neck: Negative for carotid bruits. JVD not elevated. Lungs: Clear bilaterally to auscultation without wheezes, rales, or rhonchi. Breathing is unlabored. Heart: RRR with S1 S2. No murmurs, rubs, or gallops appreciated. Abdomen: Soft, non-tender, non-distended with normoactive bowel sounds. No hepatomegaly. No rebound/guarding. No obvious abdominal masses. Msk:  Strength and tone appear normal for age. Extremities: No clubbing or cyanosis. No edema.  Distal pedal pulses are 2+ and equal bilaterally. Neuro: Alert and oriented X 3. No facial asymmetry. No focal deficit. Moves all extremities spontaneously. Psych:  Responds to questions appropriately with a normal affect.     Assessment and Plan  78 year old male status post coronary artery bypass grafting at [facility] in 2007 with left internal mammary to the LAD, saphenous vein graft to PL 1, OM1 and D1.  He also has a history of hypertension hyperlipidemia right iliac artery stent status post carotid endarterectomy who presented with chest pain is ruled in for non-ST elevation myocardial infarction.  Will need evaluation with cardiac cath.  We will discuss access point with vascular surgery since he has had a right iliac artery stent.  Patient also has a serum creatinine 1.74 this morning baseline and is 1.23.  We will need to gently hydrate this morning and repeat metabolic panel later this morning or early afternoon to determine timing  of left heart cath.  We will continue with heparin medications for now.  Signed, Teodoro Spray MD 09/11/2019, 8:33 AM Pager: [phone]

## 2019-09-11 NOTE — Progress Notes (Signed)
[place] [facility] [place] NAME: Scott Larsen    MR#:  [id number]  DATE OF BIRTH:  June 24, 1941  SUBJECTIVE:  CHIEF COMPLAINT:   Chief Complaint  Patient presents with  . Chest Pain   Patient has history of CAD and CABG.  Came with complaint of chest pain. the pain is similar to his previous cardiac event.    REVIEW OF SYSTEMS:  CONSTITUTIONAL: No fever, fatigue or weakness.  EYES: No blurred or double vision.  EARS, NOSE, AND THROAT: No tinnitus or ear pain.  RESPIRATORY: No cough, shortness of breath, wheezing or hemoptysis.  CARDIOVASCULAR: No chest pain, orthopnea, edema.  GASTROINTESTINAL: No nausea, vomiting, diarrhea or abdominal pain.  GENITOURINARY: No dysuria, hematuria.  ENDOCRINE: No polyuria, nocturia,  HEMATOLOGY: No anemia, easy bruising or bleeding SKIN: No rash or lesion. MUSCULOSKELETAL: No joint pain or arthritis.   NEUROLOGIC: No tingling, numbness, weakness.  PSYCHIATRY: No anxiety or depression.   ROS  DRUG ALLERGIES:  No Known Allergies  VITALS:  Blood pressure (!) 178/80, pulse 72, temperature 97.6 F (36.4 C), temperature source Oral, resp. rate 16, height 5\' 10"  (1.778 m), weight 67.4 kg, SpO2 100 %.  PHYSICAL EXAMINATION:  GENERAL:  78 y.o.-year-old patient lying in the bed with no acute distress.  EYES: Pupils equal, round, reactive to light and accommodation. No scleral icterus. Extraocular muscles intact.  HEENT: Head atraumatic, normocephalic. Oropharynx and nasopharynx clear.  NECK:  Supple, no jugular venous distention. No thyroid enlargement, no tenderness.  LUNGS: Normal breath sounds bilaterally, no wheezing, rales,rhonchi or crepitation. No use of accessory muscles of respiration.  CARDIOVASCULAR: S1, S2 normal. No murmurs, rubs, or gallops.  ABDOMEN: Soft, nontender, nondistended. Bowel sounds present. No organomegaly or mass.  EXTREMITIES: No pedal edema, cyanosis, or clubbing.  NEUROLOGIC:  Cranial nerves II through XII are intact. Muscle strength 5/5 in all extremities. Sensation intact. Gait not checked.  PSYCHIATRIC: The patient is alert and oriented x 3.  SKIN: No obvious rash, lesion, or ulcer.   Physical Exam LABORATORY PANEL:   CBC Recent Labs  Lab 09/11/19 0516  WBC 9.9  HGB 12.6*  HCT 37.5*  PLT 262   ------------------------------------------------------------------------------------------------------------------  Chemistries  Recent Labs  Lab 09/11/19 0516  NA 140  K 3.9  CL 104  CO2 28  GLUCOSE 131*  BUN 16  CREATININE 1.74*  CALCIUM 10.3  MG 2.0  AST 23  ALT 8  ALKPHOS 60  BILITOT 0.6   ------------------------------------------------------------------------------------------------------------------  Cardiac Enzymes No results for input(s): TROPONINI in the last 168 hours. ------------------------------------------------------------------------------------------------------------------  RADIOLOGY:  Dg Chest Portable 1 View  Result Date: 09/11/2019 CLINICAL DATA:  Chest pain EXAM: PORTABLE CHEST 1 VIEW COMPARISON:  Radiograph 08/13/2017, CT 09/22/2013 FINDINGS: Continued progression of the diffuse interstitial changes throughout the lungs. More patchy areas of opacity are seen in the right mid lung and periphery of the left lung. No pneumothorax or visible effusion. Postsurgical changes related to prior CABG including intact and aligned sternotomy wires and multiple surgical clips projecting over the mediastinum. Degenerative changes are present in the imaged spine and shoulders. IMPRESSION: Continued progression of the diffuse interstitial changes throughout the lungs. More patchy opacities in the mid lungs may reflect a superimposed consolidative process. Electronically Signed   By: Lovena Le M.D.   On: 09/11/2019 05:34    ASSESSMENT AND PLAN:   Active Problems:   NSTEMI (non-ST elevated myocardial infarction) [facility])  This is a  78 year old male admitted  for NSTEMI. 1.  NSTEMI: Continue heparin drip; the patient is n.p.o. in anticipation of heart catheterization.  Consulted cardiology. 2.  Coronary artery disease: continue aspirin and Plavix, continue metoprolol.  His LDL was slightly high so increase the dose of atorvastatin to 80 mg daily. 3.  Hypertension: Controlled; continue metoprolol and lisinopril.  Labetalol as needed. 4.  CKD: Stage III, essentially baseline but acute on chronic since previous labs.  Hydrate with intravenous fluids.  Avoid nephrotoxic agents. 5.  Hyperlipidemia: Continue statin therapy 6.  DVT prophylaxis: Therapeutic anticoagulation 7.  GI prophylaxis: None    All the records are reviewed and case discussed with Care Management/Social Workerr. Management plans discussed with the patient, family and they are in agreement.  CODE STATUS: Full code  TOTAL TIME TAKING CARE OF THIS PATIENT: 35 minutes.     POSSIBLE D/C IN 1-2 DAYS, DEPENDING ON CLINICAL CONDITION.   Vaughan Basta M.D on 09/11/2019   Between 7am to 6pm - Pager - [phone]  After 6pm go to [email or web address] - password [email or web address] [facility]  Office  [phone]  CC: Primary care physician; Sofie Hartigan, MD  Note: This dictation was prepared with Dragon dictation along with smaller phrase technology. Any transcriptional errors that result from this process are unintentional.

## 2019-09-11 NOTE — ED Notes (Signed)
Pt falling asleep, O2 Sat dropped down to 95%. Pt placed on 2L O2 Nasal Cannula, SpO2 returned to 100%.

## 2019-09-11 NOTE — Progress Notes (Signed)
Family Meeting Note  Advance Directive:yes  Today a meeting took place with the Patient.   The following clinical team members were present during this meeting:MD  The following were discussed:Patient's diagnosis: Coronary artery disease, NSTEMI, hypertension, hyperlipidemia, Patient's progosis: Unable to determine and Goals for treatment: Full Code  Additional follow-up to be provided: Cardiology  Time spent during discussion:20 minutes  Vaughan Basta, MD

## 2019-09-11 NOTE — ED Notes (Signed)
EKG repeated per Dr Karma Greaser. Pt denies pain at this time.

## 2019-09-11 NOTE — ED Notes (Signed)
Attempted to call report with no success at this time

## 2019-09-11 NOTE — ED Notes (Signed)
All ordered medication given. Pt states that he did not take his nighttime or morning dose of Metoprolol. Pt denies chest pain at this time. Pt instructed to call nurse if chest pain returns or if he develops a headache from Nitro paste.

## 2019-09-11 NOTE — ED Provider Notes (Addendum)
[facility] Emergency Department Provider Note  ____________________________________________   First MD Initiated Contact with Patient 09/11/19 [phone]     (approximate)  I have reviewed the triage vital signs and the nursing notes.   HISTORY  Chief Complaint Chest pain   HPI Scott Larsen is a 78 y.o. male with extensive prior cardiac history including CABG who presents by EMS for evaluation of acute onset and severe central heavy chest pressure with associated shortness of breath.  He says that he has not had any symptoms for years  and he definitely does not regularly have chest pain.  He was just sitting around the house tonight not doing anything in particular when the symptoms started and were acute in onset and severe.  He called EMS and they gave him a full dose aspirin after which point the pain improved and he is pain-free upon arrival to the emergency department.  Nothing in particular made the symptoms worse.  He had some sweating at the time.  He denies any recent contact with COVID-19 patients.  He denies sore throat, fever/chills, any prior shortness of breath, nausea, vomiting, and abdominal pain.  He said that he always has a cough because he continues to smoke.  He says he is compliant with his medications but he is not sure what he is taking.  Dr. Saralyn Pilar is his cardiologist and he has an appointment to see him next month but it has been 5 months since the last appointment.        Past Medical History:  Diagnosis Date  . Cancer ([place])   . Coronary artery disease   . History of kidney stones    40 years ago  . Hypertension   . Myocardial infarction [facility])     Patient Active Problem List   Diagnosis Date Noted  . Atherosclerosis of native arteries of extremity with intermittent claudication ([place]) 08/12/2019  . Hypertension 12/18/2017  . Polyneuropathy 12/07/2017  . Benign essential tremor 11/23/2017  . Weight loss 10/23/2017  .  Anxiety 10/17/2017  . Insomnia 10/17/2017  . Acute CVA (cerebrovascular accident) ([place]) 08/11/2017  . Carotid stenosis, right 08/09/2017  . Tobacco use disorder 06/22/2017  . Carotid stenosis 06/22/2017  . AAA (abdominal aortic aneurysm) without rupture ([place]) 06/22/2017  . PAD (peripheral artery disease) ([place]) 06/22/2017  . CKD (chronic kidney disease) stage 3, GFR 30-59 ml/min 06/09/2016  . H/O carotid endarterectomy 05/01/2014  . Hyperlipidemia 05/01/2014  . Ischemic cardiomyopathy 05/01/2014  . Right leg claudication ([place]) 05/01/2014  . S/P CABG x 4 05/01/2014  . S/P insertion of iliac artery stent 05/01/2014    Past Surgical History:  Procedure Laterality Date  . APPENDECTOMY    . CAROTID ENDARTERECTOMY Right   . CAROTID PTA/STENT INTERVENTION Right 08/09/2017   Procedure: Carotid PTA/Stent Intervention;  Surgeon: Algernon Huxley, MD;  Location: [place] [facility];  Service: Cardiovascular;  Laterality: Right;  . CORONARY ARTERY BYPASS GRAFT     triple bypass  . TONSILLECTOMY      Prior to Admission medications   Medication Sig Start Date End Date Taking? Authorizing Provider  acetaminophen (TYLENOL) 500 MG tablet Take 500 mg by mouth every 6 (six) hours as needed for mild pain or headache.   Yes [provider]  aspirin EC 81 MG tablet Take 81 mg by mouth daily.    Yes [provider]  clopidogrel (PLAVIX) 75 MG tablet Take 75 mg by mouth daily. 07/10/17  Yes [provider]  gabapentin (NEURONTIN) 100 MG capsule Take 200 mg by mouth 2 (two) times daily.  11/23/17  Yes [provider]  lisinopril (PRINIVIL,ZESTRIL) 5 MG tablet Take 5 mg by mouth daily. 01/23/19  Yes [provider]  metoprolol tartrate (LOPRESSOR) 25 MG tablet Take 25 mg by mouth 2 (two) times daily.  06/09/17  Yes [provider]  simvastatin (ZOCOR) 80 MG tablet Take 80 mg by mouth daily at 6 PM.   Yes [provider]  traZODone (DESYREL) 50 MG  tablet Take 50 mg by mouth at bedtime.  10/23/17  Yes [provider]  busPIRone (BUSPAR) 7.5 MG tablet Take by mouth. 10/23/17   [provider]  FLUoxetine (PROZAC) 10 MG capsule Take by mouth. 10/23/17   [provider]  nicotine (NICODERM CQ - DOSED IN MG/24 HOURS) 21 mg/24hr patch Place 21 mg onto the skin daily.    [provider]    Allergies Patient has no known allergies.  Family History  Problem Relation Age of Onset  . Heart disease Brother     Social History Social History   Tobacco Use  . Smoking status: Current Every Day Smoker    Packs/day: 1.00  . Smokeless tobacco: Never Used  Substance Use Topics  . Alcohol use: Yes    Comment: 3 beers per day  . Drug use: No    Review of Systems Constitutional: No fever/chills Eyes: No visual changes. ENT: No sore throat. Cardiovascular: Chest pressure Respiratory: Shortness of breath associated with the chest pressure Gastrointestinal: No abdominal pain.  No nausea, no vomiting.  No diarrhea.  No constipation. Genitourinary: Negative for dysuria. Musculoskeletal: Negative for neck pain.  Negative for back pain. Integumentary: Negative for rash. Neurological: Negative for headaches, focal weakness or numbness.   ____________________________________________   PHYSICAL EXAM:  VITAL SIGNS: ED Triage Vitals  Enc Vitals Group     BP 09/11/19 0510 (!) 201/93     Pulse Rate 09/11/19 0510 84     Resp 09/11/19 0510 20     Temp 09/11/19 0510 97.7 F (36.5 C)     Temp Source 09/11/19 0510 Oral     SpO2 09/11/19 0510 100 %     Weight 09/11/19 0514 72.6 kg (160 lb)     Height 09/11/19 0514 1.753 m (5\' 9" )     Head Circumference --      Peak Flow --      Pain Score 09/11/19 0514 0     Pain Loc --      Pain Edu? --      Excl. in [place]? --     Constitutional: Alert and oriented.  No acute distress, able to interact and joke with [place] upon his arrival. Eyes: Conjunctivae are normal.   Head: Atraumatic. Nose: No congestion/rhinnorhea. Mouth/Throat: Mucous membranes are moist. Neck: No stridor.  No meningeal signs.   Cardiovascular: Normal rate, regular rhythm. Good peripheral circulation. Grossly normal heart sounds.  Well-healed sternotomy scar from his CABG. Respiratory: Normal respiratory effort.  No retractions. Gastrointestinal: Soft and nontender. No distention.  Musculoskeletal: No lower extremity tenderness nor edema. No gross deformities of extremities. Neurologic:  Normal speech and language. No gross focal neurologic deficits are appreciated.  Skin:  Skin is warm, dry and intact. Psychiatric: Mood and affect are normal. Speech and behavior are normal.  ____________________________________________   LABS (all labs ordered are listed, but only abnormal results are displayed)  Labs Reviewed  COMPREHENSIVE METABOLIC PANEL -  Abnormal; Notable for the following components:      Result Value   Glucose, Bld 131 (*)    Creatinine, Ser 1.74 (*)    Albumin 3.4 (*)    GFR calc non Af Amer 37 (*)    GFR calc Af Amer 43 (*)    All other components within normal limits  CBC WITH DIFFERENTIAL/PLATELET - Abnormal; Notable for the following components:   RBC 3.67 (*)    Hemoglobin 12.6 (*)    HCT 37.5 (*)    MCV 102.2 (*)    MCH 34.3 (*)    All other components within normal limits  TROPONIN I (HIGH SENSITIVITY) - Abnormal; Notable for the following components:   Troponin I (High Sensitivity) 697 (*)    All other components within normal limits  SARS CORONAVIRUS 2 BY RT PCR (HOSPITAL ORDER, [place] [facility])  LIPASE, BLOOD  PROTIME-INR  MAGNESIUM  APTT  HEPARIN LEVEL (UNFRACTIONATED)   ____________________________________________  EKG  ED ECG REPORT #1 I, Hinda Kehr, the attending physician, personally viewed and interpreted this ECG.  Date: 09/11/2019 EKG Time: 5:13 AM Rate: 87 Rhythm: normal sinus rhythm QRS Axis: normal  Intervals: Left bundle branch block ST/T Wave abnormalities: Non-specific ST segment / T-wave changes, but no clear evidence of acute ischemia. Narrative Interpretation: no definitive evidence of acute ischemia; does not meet STEMI criteria.  There is some heavy artifact present in the precordial leads.  ED ECG REPORT #2 I, Hinda Kehr, the attending physician, personally viewed and interpreted this ECG.  Date: 09/11/2019 EKG Time: 5:47 AM Rate: 75 Rhythm: Sinus rhythm, atrial premature complex QRS Axis: normal Intervals: Left bundle branch block ST/T Wave abnormalities: Non-specific ST segment / T-wave changes, but no clear evidence of acute ischemia.  There is significant ST depression noted in V4 but it is the only lead that is suggestive of anything other than left bundle branch block. Narrative Interpretation: no definitive evidence of acute ischemia; does not meet STEMI criteria.     ____________________________________________  RADIOLOGY I, Hinda Kehr, personally viewed and evaluated these images (plain radiographs) as part of my medical decision making, as well as reviewing the written report by the radiologist.  ED MD interpretation: Interstitial lung changes, doubt acute infection  Official radiology report(s): Dg Chest Portable 1 View  Result Date: 09/11/2019 CLINICAL DATA:  Chest pain EXAM: PORTABLE CHEST 1 VIEW COMPARISON:  Radiograph 08/13/2017, CT 09/22/2013 FINDINGS: Continued progression of the diffuse interstitial changes throughout the lungs. More patchy areas of opacity are seen in the right mid lung and periphery of the left lung. No pneumothorax or visible effusion. Postsurgical changes related to prior CABG including intact and aligned sternotomy wires and multiple surgical clips projecting over the mediastinum. Degenerative changes are present in the imaged spine and shoulders. IMPRESSION: Continued progression of the diffuse interstitial changes throughout  the lungs. More patchy opacities in the mid lungs may reflect a superimposed consolidative process. Electronically Signed   By: Lovena Le M.D.   On: 09/11/2019 05:34    ____________________________________________   PROCEDURES   Procedure(s) performed (including Critical Care):  .Critical Care Performed by: Hinda Kehr, MD Authorized by: Hinda Kehr, MD   Critical care provider statement:    Critical care time (minutes):  40   Critical care time was exclusive of:  Separately billable procedures and treating other patients   Critical care was necessary to treat or prevent imminent or life-threatening deterioration of the following conditions: NSTEMI.  Critical care was time spent personally by me on the following activities:  Development of treatment plan with patient or surrogate, discussions with consultants, evaluation of patient's response to treatment, examination of patient, obtaining history from patient or surrogate, ordering and performing treatments and interventions, ordering and review of laboratory studies, ordering and review of radiographic studies, pulse oximetry, re-evaluation of patient's condition and review of old charts     ____________________________________________   [place] / MDM / [place] / ED COURSE  As part of my medical decision making, I reviewed the following data within the [place] notes reviewed and incorporated, Labs reviewed , EKG interpreted , Old EKG reviewed, Old chart reviewed, Radiograph reviewed , Discussed with admitting physician (Dr. Marcille Blanco), Discussed with cardiologist (Dr. Clayborn Bigness) and Notes from prior ED visits   Differential diagnosis includes, but is not limited to, ACS, PE, MSK, PNA, pneumothorax, aortic aneurysm.  EKG is suggestive of ischemia but not diagnostic and does not meet STEMI criteria.  Patient is currently pain-free after a full dose aspirin prior to arrival.  Labs  pending, chest x-ray pending.  Given his high risk and history anticipate he will likely need admission for further cardiac evaluation but his initial evaluation was reassuring in spite of the probability of ACS.      Clinical Course as of Sep 10 629  Thu Sep 11, 2019  0608 I discussed the case with Dr. Clayborn Bigness given the patient's high initial troponin.  He agreed with my plan for heparin bolus and infusion, nitroglycerin paste, and agreed with metoprolol 5 mg IV given the patient's hypertension and NSTEMI.  Also agreed with the plan for hospitalist admission and close cardiology consultation.    Troponin I (High Sensitivity)(!!): 697 [CF]  0617 Discussed case in person with Dr. Marcille Blanco with the hospitalist service who will admit.   [CF]  0630 SARS Coronavirus 2: NEGATIVE [CF]    Clinical Course User Index [CF] Hinda Kehr, MD     ____________________________________________  FINAL CLINICAL IMPRESSION(S) / ED DIAGNOSES  Final diagnoses:  NSTEMI (non-ST elevated myocardial infarction) [facility])     MEDICATIONS GIVEN DURING THIS VISIT:  Medications  heparin bolus via infusion 4,000 Units (4,000 Units Intravenous Bolus from Bag 09/11/19 0614)    Followed by  heparin ADULT infusion 100 units/mL (25000 units/254mL sodium chloride 0.45%) (850 Units/hr Intravenous New Bag/Given 09/11/19 0612)  nitroGLYCERIN (NITROGLYN) 2 % ointment 1 inch (1 inch Topical Given 09/11/19 0610)  metoprolol tartrate (LOPRESSOR) injection 5 mg (5 mg Intravenous Given 09/11/19 [id number])     ED Discharge Orders    None      *Please note:  Scott Larsen was evaluated in Emergency Department on 09/11/2019 for the symptoms described in the history of present illness. He was evaluated in the context of the global COVID-19 pandemic, which necessitated consideration that the patient might be at risk for infection with the SARS-CoV-2 virus that causes COVID-19. Institutional protocols and algorithms that pertain  to the evaluation of patients at risk for COVID-19 are in a state of rapid change based on information released by regulatory bodies including the CDC and federal and state organizations. These policies and algorithms were followed during the patient's care in the ED.  Some ED evaluations and interventions may be delayed as a result of limited staffing during the pandemic.*  Note:  This document was prepared using Dragon voice recognition software and may include unintentional dictation errors.   Hinda Kehr, MD  09/11/19 0630    Hinda Kehr, MD 09/11/19 0630

## 2019-09-11 NOTE — Progress Notes (Signed)
ANTICOAGULATION CONSULT NOTE - Initial Consult  Pharmacy Consult for Heparin Indication: chest pain/ACS  No Known Allergies  Patient Measurements: Height: 5\' 10"  (177.8 cm) Weight: 148 lb 11.2 oz (67.4 kg) IBW/kg (Calculated) : 73 HEPARIN DW (KG): 67.4  Vital Signs: Temp: 97.6 F (36.4 C) (10/15 0809) Temp Source: Oral (10/15 0809) BP: 178/80 (10/15 0809) Pulse Rate: 72 (10/15 0809)  Labs: Recent Labs    09/11/19 0516 09/11/19 0736 09/11/19 0817 09/11/19 0947 09/11/19 1352  HGB 12.6*  --   --   --   --   HCT 37.5*  --   --   --   --   PLT 262  --   --   --   --   APTT 30  --   --   --   --   LABPROT 12.6  --   --   --   --   INR 1.0  --   --   --   --   HEPARINUNFRC  --   --   --   --  0.46  CREATININE 1.74*  --   --   --  1.61*  TROPONINIHS 697* 1,115* 1,260* 1,591*  --    Estimated Creatinine Clearance: 37.3 mL/min (A) (by C-G formula based on SCr of 1.61 mg/dL (H)).  Medical History: Past Medical History:  Diagnosis Date  . Cancer ([place])   . Coronary artery disease   . History of kidney stones    40 years ago  . Hypertension   . Myocardial infarction [facility])    Medications:  Medications Prior to Admission  Medication Sig Dispense Refill Last Dose  . acetaminophen (TYLENOL) 500 MG tablet Take 500 mg by mouth every 6 (six) hours as needed for mild pain or headache.   prn at prn  . aspirin EC 81 MG tablet Take 81 mg by mouth daily.    Unknown at Unknown  . clopidogrel (PLAVIX) 75 MG tablet Take 75 mg by mouth daily.   Unknown at Unknown  . gabapentin (NEURONTIN) 100 MG capsule Take 200 mg by mouth 2 (two) times daily.    Unknown at Unknown  . lisinopril (PRINIVIL,ZESTRIL) 5 MG tablet Take 5 mg by mouth daily.   Unknown at Unknown  . metoprolol tartrate (LOPRESSOR) 25 MG tablet Take 25 mg by mouth 2 (two) times daily.    Unknown at Unknown  . simvastatin (ZOCOR) 80 MG tablet Take 80 mg by mouth daily at 6 PM.   Unknown at Unknown  . traZODone (DESYREL) 50 MG  tablet Take 50 mg by mouth at bedtime.    Unknown at Unknown  . busPIRone (BUSPAR) 7.5 MG tablet Take by mouth.   Not Taking at Unknown time  . FLUoxetine (PROZAC) 10 MG capsule Take by mouth.   Not Taking at Unknown time  . nicotine (NICODERM CQ - DOSED IN MG/24 HOURS) 21 mg/24hr patch Place 21 mg onto the skin daily.   Not Taking at Unknown time   Assessment: Pharmacy asked to initiate and monitor Heparin for ACS.  Per PTA med list pt not on anticoagulants.  Hgb 12.6, plt 262.  CBC stable.  Patient went for cath today (10/15).  1015 1352 HL 0.46 therapeutic x1  Goal of Therapy:  Heparin level 0.3-0.7 units/ml Monitor platelets by anticoagulation protocol: Yes   Plan:  HL currently therapeutic. Continue current rate at 850 units/hour. Check HL in 8 hours and CBC in the AM.  Gerald Dexter  09/11/2019,2:45 PM

## 2019-09-12 ENCOUNTER — Encounter: Payer: Self-pay | Admitting: Cardiology

## 2019-09-12 LAB — BASIC METABOLIC PANEL
Anion gap: 10 (ref 5–15)
BUN: 21 mg/dL (ref 8–23)
CO2: 21 mmol/L — ABNORMAL LOW (ref 22–32)
Calcium: 8.6 mg/dL — ABNORMAL LOW (ref 8.9–10.3)
Chloride: 109 mmol/L (ref 98–111)
Creatinine, Ser: 1.83 mg/dL — ABNORMAL HIGH (ref 0.61–1.24)
GFR calc Af Amer: 41 mL/min — ABNORMAL LOW (ref >60)
GFR calc non Af Amer: 35 mL/min — ABNORMAL LOW (ref >60)
Glucose, Bld: 117 mg/dL — ABNORMAL HIGH (ref 70–99)
Potassium: 4.8 mmol/L (ref 3.5–5.1)
Sodium: 140 mmol/L (ref 135–145)

## 2019-09-12 LAB — CBC
HCT: 35.2 % — ABNORMAL LOW (ref 39.0–52.0)
Hemoglobin: 11.8 g/dL — ABNORMAL LOW (ref 13.0–17.0)
MCH: 34.4 pg — ABNORMAL HIGH (ref 26.0–34.0)
MCHC: 33.5 g/dL (ref 30.0–36.0)
MCV: 102.6 fL — ABNORMAL HIGH (ref 80.0–100.0)
Platelets: 241 10*3/uL (ref 150–400)
RBC: 3.43 MIL/uL — ABNORMAL LOW (ref 4.22–5.81)
RDW: 12.9 % (ref 11.5–15.5)
WBC: 13.4 10*3/uL — ABNORMAL HIGH (ref 4.0–10.5)
nRBC: 0 % (ref 0.0–0.2)

## 2019-09-12 MED ORDER — ENOXAPARIN SODIUM 40 MG/0.4ML ~~LOC~~ SOLN
40.0000 mg | SUBCUTANEOUS | Status: DC
  Administered 2019-09-12: 22:00:00 40 mg via SUBCUTANEOUS
  Filled 2019-09-12: qty 0.4

## 2019-09-12 NOTE — Plan of Care (Signed)

## 2019-09-12 NOTE — Progress Notes (Signed)
[place] [facility] [place] NAME: Scott Larsen    MR#:  [id number]  DATE OF BIRTH:  01/10/1941  SUBJECTIVE:  CHIEF COMPLAINT:   Chief Complaint  Patient presents with  . Chest Pain   Patient has history of CAD and CABG.  Came with complaint of chest pain. the pain is similar to his previous cardiac event.  S/p cardiac cath yesterday which showed multivessel disease.  Cardiology suggested to continue medical management.  No pain today. Patient was agitated last night and appearing in alcohol withdrawal.  Injections Ativan twice and was somewhat drowsy when I saw in noon time.  REVIEW OF SYSTEMS:  CONSTITUTIONAL: No fever, fatigue or weakness.  EYES: No blurred or double vision.  EARS, NOSE, AND THROAT: No tinnitus or ear pain.  RESPIRATORY: No cough, shortness of breath, wheezing or hemoptysis.  CARDIOVASCULAR: No chest pain, orthopnea, edema.  GASTROINTESTINAL: No nausea, vomiting, diarrhea or abdominal pain.  GENITOURINARY: No dysuria, hematuria.  ENDOCRINE: No polyuria, nocturia,  HEMATOLOGY: No anemia, easy bruising or bleeding SKIN: No rash or lesion. MUSCULOSKELETAL: No joint pain or arthritis.   NEUROLOGIC: No tingling, numbness, weakness.  PSYCHIATRY: No anxiety or depression.   ROS  DRUG ALLERGIES:  No Known Allergies  VITALS:  Blood pressure 122/60, pulse (!) 55, temperature 97.6 F (36.4 C), temperature source Oral, resp. rate 17, height 5\' 10"  (1.778 m), weight 66.9 kg, SpO2 96 %.  PHYSICAL EXAMINATION:  GENERAL:  78 y.o.-year-old patient lying in the bed with no acute distress.  EYES: Pupils equal, round, reactive to light and accommodation. No scleral icterus. Extraocular muscles intact.  HEENT: Head atraumatic, normocephalic. Oropharynx and nasopharynx clear.  NECK:  Supple, no jugular venous distention. No thyroid enlargement, no tenderness.  LUNGS: Normal breath sounds bilaterally, no wheezing, rales,rhonchi or  crepitation. No use of accessory muscles of respiration.  CARDIOVASCULAR: S1, S2 normal. No murmurs, rubs, or gallops.  ABDOMEN: Soft, nontender, nondistended. Bowel sounds present. No organomegaly or mass.  EXTREMITIES: No pedal edema, cyanosis, or clubbing.  NEUROLOGIC: Cranial nerves II through XII are intact. Muscle strength 5/5 in all extremities. Sensation intact. Gait not checked.  PSYCHIATRIC: The patient is somewhat drowsy but appears orientedX3.  SKIN: No obvious rash, lesion, or ulcer.   Physical Exam LABORATORY PANEL:   CBC Recent Labs  Lab 09/12/19 0505  WBC 13.4*  HGB 11.8*  HCT 35.2*  PLT 241   ------------------------------------------------------------------------------------------------------------------  Chemistries  Recent Labs  Lab 09/11/19 0516  09/12/19 0505  NA 140   < > 140  K 3.9   < > 4.8  CL 104   < > 109  CO2 28   < > 21*  GLUCOSE 131*   < > 117*  BUN 16   < > 21  CREATININE 1.74*   < > 1.83*  CALCIUM 10.3   < > 8.6*  MG 2.0  --   --   AST 23  --   --   ALT 8  --   --   ALKPHOS 60  --   --   BILITOT 0.6  --   --    < > = values in this interval not displayed.   ------------------------------------------------------------------------------------------------------------------  Cardiac Enzymes No results for input(s): TROPONINI in the last 168 hours. ------------------------------------------------------------------------------------------------------------------  RADIOLOGY:  Dg Chest Portable 1 View  Result Date: 09/11/2019 CLINICAL DATA:  Chest pain EXAM: PORTABLE CHEST 1 VIEW COMPARISON:  Radiograph 08/13/2017, CT 09/22/2013 FINDINGS: Continued  progression of the diffuse interstitial changes throughout the lungs. More patchy areas of opacity are seen in the right mid lung and periphery of the left lung. No pneumothorax or visible effusion. Postsurgical changes related to prior CABG including intact and aligned sternotomy wires and  multiple surgical clips projecting over the mediastinum. Degenerative changes are present in the imaged spine and shoulders. IMPRESSION: Continued progression of the diffuse interstitial changes throughout the lungs. More patchy opacities in the mid lungs may reflect a superimposed consolidative process. Electronically Signed   By: Lovena Le M.D.   On: 09/11/2019 05:34    ASSESSMENT AND PLAN:   Active Problems:   NSTEMI (non-ST elevated myocardial infarction) [facility])  This is a 78 year old male admitted for NSTEMI. 1.  NSTEMI:  Given heparin drip initially, cardiac cath was done which showed multivessel disease.  Cardiology suggested to continue aggressive medical management. 2.  Coronary artery disease: continue aspirin and Plavix, continue metoprolol.  His LDL was slightly high so increase the dose of atorvastatin to 80 mg daily. 3.  Hypertension: Controlled; continue metoprolol and lisinopril.  Labetalol as needed. 4.  CKD: Stage III, essentially baseline but acute on chronic since previous labs.  Hydrate with intravenous fluids.  Avoid nephrotoxic agents. 5.  Hyperlipidemia: Continue statin therapy. 6.  DVT prophylaxis: Therapeutic anticoagulation 7.  GI prophylaxis: None 8.  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 Management plans discussed with the patient, family and they are in agreement.  CODE STATUS: Full code  TOTAL TIME TAKING CARE OF THIS PATIENT: 35 minutes.  I called patient's contact, brother on the phone listed here.  Left a voicemail but could not talk to him to update about the condition.   POSSIBLE D/C IN 1-2 DAYS, DEPENDING ON CLINICAL CONDITION.   Vaughan Basta M.D on 09/12/2019   Between 7am to 6pm - Pager - [phone]  After 6pm go to [email or web address] - password [email or web address] [facility]  Office  [phone]  CC: Primary care physician;  Sofie Hartigan, MD  Note: This dictation was prepared with Dragon dictation along with smaller phrase technology. Any transcriptional errors that result from this process are unintentional.

## 2019-09-12 NOTE — Plan of Care (Signed)
  Problem: Activity: Goal: Risk for activity intolerance will decrease Outcome: Progressing   Problem: Pain Managment: Goal: General experience of comfort will improve Outcome: Progressing   Problem: Safety: Goal: Ability to remain free from injury will improve Outcome: Progressing   Problem: Activity: Goal: Ability to return to baseline activity level will improve Outcome: Progressing   Problem: Cardiovascular: Goal: Ability to achieve and maintain adequate cardiovascular perfusion will improve Outcome: Progressing Goal: Vascular access site(s) Level 0-1 will be maintained Outcome: Progressing

## 2019-09-12 NOTE — Progress Notes (Signed)
Patient reports that he has drank one beer everyday for the last six months. Moderate to severe tremors noted. Patient having difficulty doing simple task such as picking up cup without spilling contents. Patient reports that he has had tremors since he was 78 years old. MD Jannifer Franklin notified. MD to place orders for CIWA. Will continue to monitor.   Scott Larsen

## 2019-09-12 NOTE — Progress Notes (Signed)
Patient Name: Scott Larsen Date of Encounter: 09/12/2019  Hospital Problem List     Active Problems:   NSTEMI (non-ST elevated myocardial infarction) [facility] [address])    Patient Profile     78 y.o. male with history of coronary artery disease status post coronary artery bypass grafting x4 in 2007 at [facility] per Dr. Marnee Guarneri with a left internal mammary to the LAD, saphenous vein graft to the PL, saphenous vein graft OM1, saphenous vein graft to D1.  He also has a history of peripheral vascular disease status post carotid endarterectomies.  He is also status post right iliac stent.  Presented with chest pain.  Ruled in for a non-ST elevation myocardial infarction.  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  Medical management  Subjective   No further chest pain  Inpatient Medications    . aspirin EC  81 mg Oral Daily  . atorvastatin  80 mg Oral q1800  . busPIRone  7.5 mg Oral BID  . clopidogrel  75 mg Oral Daily  . docusate sodium  100 mg Oral BID  . FLUoxetine  10 mg Oral QHS  . folic acid  1 mg Oral Daily  . gabapentin  200 mg Oral BID  . lisinopril  5 mg Oral Daily  . metoprolol tartrate  50 mg Oral BID  . multivitamin with minerals  1 tablet Oral Daily  . nicotine  21 mg Transdermal Daily  . sodium chloride flush  3 mL Intravenous Q12H  . thiamine  100 mg Oral Daily   Or  . thiamine  100 mg Intravenous Daily  . traZODone  50 mg Oral QHS    Vital Signs    Vitals:   09/12/19 0010 09/12/19 0323 09/12/19 0500 09/12/19 0733  BP: 118/64 126/83  135/65  Pulse: 78 76  65  Resp: 16 16  16   Temp: 99.5 F (37.5 C) 98.5 F (36.9 C)  98.2 F (36.8 C)  TempSrc: Oral Oral  Oral  SpO2: 93% 99%  97%  Weight:   66.9 kg   Height:        Intake/Output Summary (Last 24 hours) at 09/12/2019 0816 Last data filed at 09/12/2019 0304 Gross per 24  hour  Intake 614.25 ml  Output 150 ml  Net 464.25 ml   Filed Weights   09/11/19 0514 09/11/19 0810 09/12/19 0500  Weight: 72.6 kg 67.4 kg 66.9 kg    Physical Exam    GEN: Well nourished, well developed, in no acute distress.  HEENT: normal.  Neck: Supple, no JVD, carotid bruits, or masses. Cardiac: RRR, no murmurs, rubs, or gallops. No clubbing, cyanosis, edema.  Radials/DP/PT 2+ and equal bilaterally.  Respiratory:  Respirations regular and unlabored, clear to auscultation bilaterally. GI: Soft, nontender, nondistended, BS + x 4. MS: no deformity or atrophy. Skin: warm and dry, no rash. Neuro:  Strength and sensation are intact. Psych: Normal affect.  Labs    CBC Recent Labs    09/11/19 0516 09/12/19 0505  WBC 9.9 13.4*  NEUTROABS 5.8  --   HGB 12.6* 11.8*  HCT 37.5* 35.2*  MCV 102.2* 102.6*  PLT 262 [id number]   Basic Metabolic Panel Recent Labs    09/11/19 0516 09/11/19 1352 09/12/19 0505  NA 140 137 140  K 3.9 4.2 4.8  CL 104 108 109  CO2 28 24  21*  GLUCOSE 131* 108* 117*  BUN 16 18 21   CREATININE 1.74* 1.61* 1.83*  CALCIUM 10.3 9.3 8.6*  MG 2.0  --   --    Liver Function Tests Recent Labs    09/11/19 0516  AST 23  ALT 8  ALKPHOS 60  BILITOT 0.6  PROT 7.3  ALBUMIN 3.4*   Recent Labs    09/11/19 0516  LIPASE 21   Cardiac Enzymes No results for input(s): CKTOTAL, CKMB, CKMBINDEX, TROPONINI in the last 72 hours. BNP No results for input(s): BNP in the last 72 hours. D-Dimer No results for input(s): DDIMER in the last 72 hours. Hemoglobin A1C Recent Labs    09/11/19 0817  HGBA1C 5.6   Fasting Lipid Panel No results for input(s): CHOL, HDL, LDLCALC, TRIG, CHOLHDL, LDLDIRECT in the last 72 hours. Thyroid Function Tests Recent Labs    09/11/19 0817  TSH 2.004    Telemetry    Sinus rhythm  ECG    Sinus rhythm with LVH  Radiology    Dg Chest Portable 1 View  Result Date: 09/11/2019 CLINICAL DATA:  Chest pain EXAM: PORTABLE  CHEST 1 VIEW COMPARISON:  Radiograph 08/13/2017, CT 09/22/2013 FINDINGS: Continued progression of the diffuse interstitial changes throughout the lungs. More patchy areas of opacity are seen in the right mid lung and periphery of the left lung. No pneumothorax or visible effusion. Postsurgical changes related to prior CABG including intact and aligned sternotomy wires and multiple surgical clips projecting over the mediastinum. Degenerative changes are present in the imaged spine and shoulders. IMPRESSION: Continued progression of the diffuse interstitial changes throughout the lungs. More patchy opacities in the mid lungs may reflect a superimposed consolidative process. Electronically Signed   By: Lovena Le M.D.   On: 09/11/2019 05:34    Assessment & Plan    Coronary artery disease-status post coronary bypass grafting.  Cath revealed three-vessel native disease, patent saphenous vein graft to D1, occluded saphenous vein graft to distal RCA, patent saphenous vein graft to OM1 with multiple mid and distal lesions.  Unable to visualize LIMA due to diffuse atherosclerosis in the aortic arch.  EF 35 to 40% with inferior wall akinesis.  Will need to attempt medical management.  We will continue with dual antiplatelet therapy and admission medications.  Would ambulate and consider discharge if stable will from the cath site as well as anginal standpoint.  Follow-up with Dr. Saralyn Pilar in 1 week.  Signed, Javier Docker Ariston Grandison MD 09/12/2019, 8:16 AM  Pager: [phone]

## 2019-09-13 LAB — CBC
HCT: 34.8 % — ABNORMAL LOW (ref 39.0–52.0)
Hemoglobin: 11.3 g/dL — ABNORMAL LOW (ref 13.0–17.0)
MCH: 33.6 pg (ref 26.0–34.0)
MCHC: 32.5 g/dL (ref 30.0–36.0)
MCV: 103.6 fL — ABNORMAL HIGH (ref 80.0–100.0)
Platelets: 225 10*3/uL (ref 150–400)
RBC: 3.36 MIL/uL — ABNORMAL LOW (ref 4.22–5.81)
RDW: 12.8 % (ref 11.5–15.5)
WBC: 11.6 10*3/uL — ABNORMAL HIGH (ref 4.0–10.5)
nRBC: 0 % (ref 0.0–0.2)

## 2019-09-13 LAB — BASIC METABOLIC PANEL
Anion gap: 14 (ref 5–15)
BUN: 25 mg/dL — ABNORMAL HIGH (ref 8–23)
CO2: 19 mmol/L — ABNORMAL LOW (ref 22–32)
Calcium: 8.4 mg/dL — ABNORMAL LOW (ref 8.9–10.3)
Chloride: 104 mmol/L (ref 98–111)
Creatinine, Ser: 1.89 mg/dL — ABNORMAL HIGH (ref 0.61–1.24)
GFR calc Af Amer: 39 mL/min — ABNORMAL LOW (ref >60)
GFR calc non Af Amer: 34 mL/min — ABNORMAL LOW (ref >60)
Glucose, Bld: 136 mg/dL — ABNORMAL HIGH (ref 70–99)
Potassium: 4.1 mmol/L (ref 3.5–5.1)
Sodium: 137 mmol/L (ref 135–145)

## 2019-09-13 MED ORDER — FOLIC ACID 1 MG PO TABS: 1.0000 mg | ORAL_TABLET | Freq: Every day | ORAL | 0 refills | Status: AC

## 2019-09-13 MED ORDER — THIAMINE HCL 100 MG PO TABS: 100.0000 mg | ORAL_TABLET | Freq: Every day | ORAL | 0 refills | Status: AC

## 2019-09-13 MED ORDER — METOPROLOL TARTRATE 50 MG PO TABS: 50.0000 mg | ORAL_TABLET | Freq: Two times a day (BID) | ORAL | 0 refills | Status: AC

## 2019-09-13 NOTE — Progress Notes (Signed)
Patient Name: Scott Larsen Date of Encounter: 09/13/2019  Hospital Problem List     Active Problems:   NSTEMI (non-ST elevated myocardial infarction) [facility])    Patient Profile     78 year old male with history of coronary disease coronary bypass grafting alcohol abuse who is admitted with non-ST elevation myocardial infarction.  Cath revealed diffuse three-vessel disease with graft disease.  He has severe peripheral vascular disease.  Not amenable to PCI or redo CABG.  Medical management is recommended.  Doing better this morning.  Less confused.  Was apparently withdrawing from alcohol yesterday.  Appears hemodynamically stable today.  No chest pain.  Subjective   Much less confused today.  Able to ambulate without difficulty.  Inpatient Medications    . aspirin EC  81 mg Oral Daily  . atorvastatin  80 mg Oral q1800  . busPIRone  7.5 mg Oral BID  . clopidogrel  75 mg Oral Daily  . docusate sodium  100 mg Oral BID  . enoxaparin (LOVENOX) injection  40 mg Subcutaneous Q24H  . FLUoxetine  10 mg Oral QHS  . folic acid  1 mg Oral Daily  . gabapentin  200 mg Oral BID  . lisinopril  5 mg Oral Daily  . metoprolol tartrate  50 mg Oral BID  . multivitamin with minerals  1 tablet Oral Daily  . nicotine  21 mg Transdermal Daily  . sodium chloride flush  3 mL Intravenous Q12H  . thiamine  100 mg Oral Daily   Or  . thiamine  100 mg Intravenous Daily  . traZODone  50 mg Oral QHS    Vital Signs    Vitals:   09/12/19 1458 09/12/19 1917 09/13/19 0400 09/13/19 0713  BP: 122/60 93/68 100/73 (!) 137/91  Pulse: (!) 55 71 98 89  Resp: 17   19  Temp: 97.6 F (36.4 C) 98.6 F (37 C) 98.3 F (36.8 C) 98.5 F (36.9 C)  TempSrc: Oral Oral Oral Oral  SpO2: 96% 99% 92% 96%  Weight:   66 kg   Height:        Intake/Output Summary (Last 24 hours) at 09/13/2019 0950 Last data filed at 09/13/2019 0418 Gross per 24 hour  Intake -  Output 400 ml  Net -400 ml   Filed Weights    09/11/19 0810 09/12/19 0500 09/13/19 0400  Weight: 67.4 kg 66.9 kg 66 kg    Physical Exam    GEN: Well nourished, well developed, in no acute distress.  HEENT: normal.  Neck: Supple, no JVD, carotid bruits, or masses. Cardiac: RRR, no murmurs, rubs, or gallops. No clubbing, cyanosis, edema.  Radials/DP/PT 2+ and equal bilaterally.  Respiratory:  Respirations regular and unlabored, clear to auscultation bilaterally. GI: Soft, nontender, nondistended, BS + x 4. MS: no deformity or atrophy. Skin: warm and dry, no rash. Neuro:  Strength and sensation are intact. Psych: Normal affect.  Labs    CBC Recent Labs    09/11/19 0516 09/12/19 0505 09/13/19 0609  WBC 9.9 13.4* 11.6*  NEUTROABS 5.8  --   --   HGB 12.6* 11.8* 11.3*  HCT 37.5* 35.2* 34.8*  MCV 102.2* 102.6* 103.6*  PLT 262 241 [id number]   Basic Metabolic Panel Recent Labs    09/11/19 0516  09/12/19 0505 09/13/19 0609  NA 140   < > 140 137  K 3.9   < > 4.8 4.1  CL 104   < > 109 104  CO2 28   < >  21* 19*  GLUCOSE 131*   < > 117* 136*  BUN 16   < > 21 25*  CREATININE 1.74*   < > 1.83* 1.89*  CALCIUM 10.3   < > 8.6* 8.4*  MG 2.0  --   --   --    < > = values in this interval not displayed.   Liver Function Tests Recent Labs    09/11/19 0516  AST 23  ALT 8  ALKPHOS 60  BILITOT 0.6  PROT 7.3  ALBUMIN 3.4*   Recent Labs    09/11/19 0516  LIPASE 21   Cardiac Enzymes No results for input(s): CKTOTAL, CKMB, CKMBINDEX, TROPONINI in the last 72 hours. BNP No results for input(s): BNP in the last 72 hours. D-Dimer No results for input(s): DDIMER in the last 72 hours. Hemoglobin A1C Recent Labs    09/11/19 0817  HGBA1C 5.6   Fasting Lipid Panel No results for input(s): CHOL, HDL, LDLCALC, TRIG, CHOLHDL, LDLDIRECT in the last 72 hours. Thyroid Function Tests Recent Labs    09/11/19 0817  TSH 2.004    Telemetry    Sinus rhythm with no ischemia  ECG    Sinus rhythm left bundle branch  block  Radiology    Dg Chest Portable 1 View  Result Date: 09/11/2019 CLINICAL DATA:  Chest pain EXAM: PORTABLE CHEST 1 VIEW COMPARISON:  Radiograph 08/13/2017, CT 09/22/2013 FINDINGS: Continued progression of the diffuse interstitial changes throughout the lungs. More patchy areas of opacity are seen in the right mid lung and periphery of the left lung. No pneumothorax or visible effusion. Postsurgical changes related to prior CABG including intact and aligned sternotomy wires and multiple surgical clips projecting over the mediastinum. Degenerative changes are present in the imaged spine and shoulders. IMPRESSION: Continued progression of the diffuse interstitial changes throughout the lungs. More patchy opacities in the mid lungs may reflect a superimposed consolidative process. Electronically Signed   By: Lovena Le M.D.   On: 09/11/2019 05:34    Assessment & Plan    Current artery disease-status post carotid bypass grafting x3.  LIMA to LAD not visible due to subclavian disease, disease and vein graft.  Not amenable to PCI or redo CABG.  Medical management is recommended.  We will continue with aspirin and Plavix.  We will continue remainder of his home medications.  Would ambulate today and consider discharge to home with outpatient follow-up with Dr. Saralyn Pilar.  Signed, Javier Docker Marilyn Wing MD 09/13/2019, 9:50 AM  Pager: [phone]

## 2019-09-13 NOTE — Discharge Instructions (Signed)
Heart Attack A heart attack occurs when blood and oxygen supply to the heart is cut off. A heart attack causes damage to the heart that cannot be fixed. A heart attack is also called a myocardial infarction, or MI. If you think you are having a heart attack, do not wait to see if the symptoms will go away. Get medical help right away. What are the causes? This condition may be caused by:  A fatty substance (plaque) in the blood vessels (arteries). This can block the flow of blood to the heart.  A blood clot in the blood vessels that go to the heart. The blood clot blocks blood flow.  Low blood pressure.  An abnormal heartbeat.  Some diseases, such as problems in red blood cells (anemia)orproblems in breathing (respiratory failure).  Tightening (spasm) of a blood vessel that cuts off blood to the heart.  A tear in a blood vessel of the heart.  High blood pressure. What increases the risk? The following factors may make you more likely to develop this condition:  Aging. The older you are, the higher your risk.  Having a personal or family history of chest pain, heart attack, stroke, or narrowing of the arteries in the legs, arms, head, or stomach (peripheral artery disease).  Being male.  Smoking.  Not getting regular exercise.  Being overweight or obese.  Having high blood pressure.  Having high cholesterol.  Having diabetes.  Drinking too much alcohol.  Using illegal drugs, such as cocaine or methamphetamine. What are the signs or symptoms? Symptoms of this condition include:  Chest pain. It may feel like: ? Crushing or squeezing. ? Tightness, pressure, fullness, or heaviness.  Pain in the arm, neck, jaw, back, or upper body.  Shortness of breath.  Heartburn.  Upset stomach (indigestion).  Feeling like you may vomit (nauseous).  Cold sweats.  Feeling tired.  Sudden light-headedness. How is this treated? A heart attack must be treated as soon as  possible. Treatment may include:  Medicines to: ? Break up or dissolve blood clots. ? Thin blood and help prevent blood clots. ? Treat blood pressure. ? Improve blood flow to the heart. ? Reduce pain. ? Reduce cholesterol.  Procedures to widen a blocked artery and keep it open.  Open heart surgery.  Receiving oxygen.  Making your heart strong again (cardiac rehabilitation) through exercise, education, and counseling. Follow these instructions at home: Medicines  Take over-the-counter and prescription medicines only as told by your doctor. You may need to take medicine: ? To keep your blood from clotting too easily. ? To control blood pressure. ? To lower cholesterol. ? To control heart rhythms.  Do not take these medicines unless your doctor says it is okay: ? NSAIDs, such as ibuprofen. ? Supplements that have vitamin A, vitamin E, or both. ? Hormone replacement therapy that has estrogen with or without progestin. Lifestyle      Do not use any products that have nicotine or tobacco, such as cigarettes, e-cigarettes, and chewing tobacco. If you need help quitting, ask your doctor.  Avoid secondhand smoke.  Exercise regularly. Ask your doctor about a cardiac rehab program.  Eat heart-healthy foods. Your doctor will tell you what foods to eat.  Stay at a healthy weight.  Lower your stress level.  Do not use illegal drugs. Alcohol use  Do not drink alcohol if: ? Your doctor tells you not to drink. ? You are pregnant, may be pregnant, or are planning to become pregnant.  If you drink alcohol: °? Limit how much you use to: °§ 0-1 drink a day for women. °§ 0-2 drinks a day for men. °? Know how much alcohol is in your drink. In the [place] one drink equals one 12 oz bottle of beer (355 mL), one 5 oz glass of wine (148 mL), or one 1½ oz glass of hard liquor (44 mL). °General instructions °· Work with your doctor to treat other problems you may have, such as diabetes or high  blood pressure. °· Get screened for depression. Get treatment if needed. °· Keep your vaccines up to date. Get the flu shot (influenza vaccine) every year. °· Keep all follow-up visits as told by your doctor. This is important. °Contact a doctor if: °· You feel very sad. °· You have trouble doing your daily activities. °Get help right away if: °· You have sudden, unexplained discomfort in your chest, arms, back, neck, jaw, or upper body. °· You have shortness of breath. °· You have sudden sweating or clammy skin. °· You feel like you may vomit. °· You vomit. °· You feel tired or weak. °· You get light-headed or dizzy. °· You feel your heart beating fast. °· You feel your heart skipping beats. °· You have blood pressure that is higher than 180/120. °These symptoms may be an emergency. Do not wait to see if the symptoms will go away. Get medical help right away. Call your local emergency services (911 in the [place] Do not drive yourself to the hospital. °Summary °· A heart attack occurs when blood and oxygen supply to the heart is cut off. °· Do not take NSAIDs unless your doctor says it is okay. °· Do not smoke. Avoid secondhand smoke. °· Exercise regularly. Ask your doctor about a cardiac rehab program. °This information is not intended to replace advice given to you by your health care provider. Make sure you discuss any questions you have with your health care provider. °Document Released: 05/14/2012 Document Revised: 02/24/2019 Document Reviewed: 02/24/2019 °[employer] Patient Education © 2020 [employer] ° °

## 2019-09-13 NOTE — Discharge Summary (Signed)
[facility] NAME: Scott Larsen    MR#:  [id number]  DATE OF BIRTH:  23-Mar-1941  DATE OF ADMISSION:  09/11/2019 ADMITTING PHYSICIAN: Harrie Foreman, MD  DATE OF DISCHARGE: 09/13/2019   PRIMARY CARE PHYSICIAN: Sofie Hartigan, MD    ADMISSION DIAGNOSIS:  NSTEMI (non-ST elevated myocardial infarction) ([place]) [I21.4]  DISCHARGE DIAGNOSIS:  Active Problems:   NSTEMI (non-ST elevated myocardial infarction) ([place])   SECONDARY DIAGNOSIS:   Past Medical History:  Diagnosis Date  . Cancer ([place])   . Coronary artery disease   . History of kidney stones    40 years ago  . Hypertension   . Myocardial infarction [facility])     HOSPITAL COURSE:   1. NSTEMI:  Given heparin drip initially, cardiac cath was done which showed multivessel disease.  Cardiology suggested to continue aggressive medical management. Normal chest pain. 2. Coronary artery disease: continue aspirin and Plavix, continue metoprolol.  His LDL was slightly high so increase the dose of atorvastatin to 80 mg daily. 3. Hypertension: Controlled; continue metoprolol and lisinopril. Labetalol as needed. 4. CKD: Stage III, essentially baseline but acute on chronic since previous labs. Hydrate with intravenous fluids. Avoid nephrotoxic agents. 5. Hyperlipidemia: Continue statin therapy. 6. DVT prophylaxis: Therapeutic anticoagulation 7. GI prophylaxis: None 8.  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  He is completely alert and oriented and feels fine.  DISCHARGE CONDITIONS:   Stable  CONSULTS OBTAINED:    DRUG ALLERGIES:  No Known Allergies  DISCHARGE MEDICATIONS:   Allergies as of 09/13/2019   No Known Allergies     Medication List    TAKE these medications   acetaminophen 500 MG tablet Commonly known as: TYLENOL Take 500 mg by mouth every 6 (six) hours as needed for mild  pain or headache.   aspirin EC 81 MG tablet Take 81 mg by mouth daily.   busPIRone 7.5 MG tablet Commonly known as: BUSPAR Take by mouth.   clopidogrel 75 MG tablet Commonly known as: PLAVIX Take 75 mg by mouth daily.   FLUoxetine 10 MG capsule Commonly known as: PROZAC Take by mouth.   folic acid 1 MG tablet Commonly known as: FOLVITE Take 1 tablet (1 mg total) by mouth daily. Start taking on: September 14, 2019   gabapentin 100 MG capsule Commonly known as: NEURONTIN Take 200 mg by mouth 2 (two) times daily.   lisinopril 5 MG tablet Commonly known as: ZESTRIL Take 5 mg by mouth daily.   metoprolol tartrate 50 MG tablet Commonly known as: LOPRESSOR Take 1 tablet (50 mg total) by mouth 2 (two) times daily. What changed:   medication strength  how much to take   nicotine 21 mg/24hr patch Commonly known as: NICODERM CQ - dosed in mg/24 hours Place 21 mg onto the skin daily.   simvastatin 80 MG tablet Commonly known as: ZOCOR Take 80 mg by mouth daily at 6 PM.   thiamine 100 MG tablet Take 1 tablet (100 mg total) by mouth daily. Start taking on: September 14, 2019   traZODone 50 MG tablet Commonly known as: DESYREL Take 50 mg by mouth at bedtime.        DISCHARGE INSTRUCTIONS:   Follow with cardiologist in 1 week.  If you experience worsening of your admission symptoms, develop shortness of breath, life threatening emergency, suicidal or homicidal thoughts you must seek  medical attention immediately by calling 911 or calling your MD immediately  if symptoms less severe.  You Must read complete instructions/literature along with all the possible adverse reactions/side effects for all the Medicines you take and that have been prescribed to you. Take any new Medicines after you have completely understood and accept all the possible adverse reactions/side effects.   Please note  You were cared for by a hospitalist during your hospital stay. If you have any  questions about your discharge medications or the care you received while you were in the hospital after you are discharged, you can call the unit and asked to speak with the hospitalist on call if the hospitalist that took care of you is not available. Once you are discharged, your primary care physician will handle any further medical issues. Please note that NO REFILLS for any discharge medications will be authorized once you are discharged, as it is imperative that you return to your primary care physician (or establish a relationship with a primary care physician if you do not have one) for your aftercare needs so that they can reassess your need for medications and monitor your lab values.    Today   CHIEF COMPLAINT:   Chief Complaint  Patient presents with  . Chest Pain    HISTORY OF PRESENT ILLNESS:  Scott Larsen  is a 78 y.o. male with a known history of coronary artery disease status post CABG, hypertension and tobacco abuse presents to the emergency department complaining of chest pain.  The patient reports his pain began at rest approximately 8 hours prior to presentation.  The patient took aspirin which relieved the chest pain at the time but began to have some nagging substernal chest pressure.  Laboratory evaluation in the emergency department was significant for troponin greater than 600.  The patient does not have any EKGs on file for comparison but had some nonspecific changes in V4.  Nitro paste was applied to his chest and the patient was started on a heparin drip prior to the emergency department staff calling the hospitalist service for admission.   VITAL SIGNS:  Blood pressure (!) 137/91, pulse 89, temperature 98.5 F (36.9 C), temperature source Oral, resp. rate 19, height 5\' 10"  (1.778 m), weight 66 kg, SpO2 96 %.  I/O:    Intake/Output Summary (Last 24 hours) at 09/13/2019 1314 Last data filed at 09/13/2019 1209 Gross per 24 hour  Intake 360 ml  Output 800 ml   Net -440 ml    PHYSICAL EXAMINATION:  GENERAL:  78 y.o.-year-old patient lying in the bed with no acute distress.  EYES: Pupils equal, round, reactive to light and accommodation. No scleral icterus. Extraocular muscles intact.  HEENT: Head atraumatic, normocephalic. Oropharynx and nasopharynx clear.  NECK:  Supple, no jugular venous distention. No thyroid enlargement, no tenderness.  LUNGS: Normal breath sounds bilaterally, no wheezing, rales,rhonchi or crepitation. No use of accessory muscles of respiration.  CARDIOVASCULAR: S1, S2 normal. No murmurs, rubs, or gallops.  ABDOMEN: Soft, non-tender, non-distended. Bowel sounds present. No organomegaly or mass.  EXTREMITIES: No pedal edema, cyanosis, or clubbing.  NEUROLOGIC: Cranial nerves II through XII are intact. Muscle strength 5/5 in all extremities. Sensation intact. Gait not checked.  PSYCHIATRIC: The patient is alert and oriented x 3.  SKIN: No obvious rash, lesion, or ulcer.   DATA REVIEW:   CBC Recent Labs  Lab 09/13/19 0609  WBC 11.6*  HGB 11.3*  HCT 34.8*  PLT 225  Chemistries  Recent Labs  Lab 09/11/19 0516  09/13/19 0609  NA 140   < > 137  K 3.9   < > 4.1  CL 104   < > 104  CO2 28   < > 19*  GLUCOSE 131*   < > 136*  BUN 16   < > 25*  CREATININE 1.74*   < > 1.89*  CALCIUM 10.3   < > 8.4*  MG 2.0  --   --   AST 23  --   --   ALT 8  --   --   ALKPHOS 60  --   --   BILITOT 0.6  --   --    < > = values in this interval not displayed.    Cardiac Enzymes No results for input(s): TROPONINI in the last 168 hours.  Microbiology Results  Results for orders placed or performed during the hospital encounter of 09/11/19  SARS Coronavirus 2 by RT PCR (hospital order, performed in [facility] hospital lab) Nasopharyngeal Nasopharyngeal Swab     Status: None   Collection Time: 09/11/19  5:16 AM   Specimen: Nasopharyngeal Swab  Result Value Ref Range Status   SARS Coronavirus 2 NEGATIVE NEGATIVE Final     Comment: (NOTE) If result is NEGATIVE SARS-CoV-2 target nucleic acids are NOT DETECTED. The SARS-CoV-2 RNA is generally detectable in upper and lower  respiratory specimens during the acute phase of infection. The lowest  concentration of SARS-CoV-2 viral copies this assay can detect is 250  copies / mL. A negative result does not preclude SARS-CoV-2 infection  and should not be used as the sole basis for treatment or other  patient management decisions.  A negative result may occur with  improper specimen collection / handling, submission of specimen other  than nasopharyngeal swab, presence of viral mutation(s) within the  areas targeted by this assay, and inadequate number of viral copies  (<250 copies / mL). A negative result must be combined with clinical  observations, patient history, and epidemiological information. If result is POSITIVE SARS-CoV-2 target nucleic acids are DETECTED. The SARS-CoV-2 RNA is generally detectable in upper and lower  respiratory specimens dur ing the acute phase of infection.  Positive  results are indicative of active infection with SARS-CoV-2.  Clinical  correlation with patient history and other diagnostic information is  necessary to determine patient infection status.  Positive results do  not rule out bacterial infection or co-infection with other viruses. If result is PRESUMPTIVE POSTIVE SARS-CoV-2 nucleic acids MAY BE PRESENT.   A presumptive positive result was obtained on the submitted specimen  and confirmed on repeat testing.  While 2019 novel coronavirus  (SARS-CoV-2) nucleic acids may be present in the submitted sample  additional confirmatory testing may be necessary for epidemiological  and / or clinical management purposes  to differentiate between  SARS-CoV-2 and other Sarbecovirus currently known to infect humans.  If clinically indicated additional testing with an alternate test  methodology [phone]) is advised. The SARS-CoV-2  RNA is generally  detectable in upper and lower respiratory sp ecimens during the acute  phase of infection. The expected result is Negative. Fact Sheet for Patients:  StrictlyIdeas.no Fact Sheet for Healthcare Providers: BankingDealers.co.za This test is not yet approved or cleared by the [place] FDA and has been authorized for detection and/or diagnosis of SARS-CoV-2 by FDA under an Emergency Use Authorization (EUA).  This EUA will remain in effect (meaning this test can be used)  for the duration of the COVID-19 declaration under Section 564(b)(1) of the Act, 21 U.S.C. section 360bbb-3(b)(1), unless the authorization is terminated or revoked sooner. Performed at [facility], [address] [place], [place] [address]     RADIOLOGY:  No results found.  EKG:   Orders placed or performed during the hospital encounter of 09/11/19  . EKG 12-Lead  . EKG 12-Lead  . ED EKG  . ED EKG  . EKG 12-Lead  . EKG 12-Lead  . EKG      Management plans discussed with the patient, family and they are in agreement.  CODE STATUS:     Code Status Orders  (From admission, onward)         Start     Ordered   09/11/19 0755  Full code  Continuous     09/11/19 0754        Code Status History    Date Active Date Inactive Code Status Order ID Comments User Context   08/09/2017 1405 08/17/2017 2339 Full Code [id number]  Algernon Huxley, MD Inpatient   Advance Care Planning Activity      TOTAL TIME TAKING CARE OF THIS PATIENT: *35 minutes.    Vaughan Basta M.D on 09/13/2019 at 1:14 PM  Between 7am to 6pm - Pager - [phone]  After 6pm go to [email or web address] - password [email or web address] [facility]  Office  [phone]  CC: Primary care physician; Sofie Hartigan, MD   Note: This dictation was prepared with Dragon dictation along with smaller phrase technology. Any transcriptional errors that  result from this process are unintentional.

## 2019-09-13 NOTE — Progress Notes (Signed)
Physical Therapy Evaluation Patient Details Name: Scott Larsen MRN: [id number] DOB: 11-20-1941 Today's Date: 09/13/2019   History of Present Illness  78 year old male with history of coronary disease coronary bypass grafting alcohol abuse who is admitted with non-ST elevation myocardial infarction.   Clinical Impression  Patient presents alert and orientated. He reports just finishing walking around the nurses station with the nurse and no difficulties. He has 3/5 strength to BLE, is independent with bed mobility and transfers. He has good static and dynamic standing balance. He was not ready to walk again at this time due to just getting back from ambulation. He will benefit from skilled PT to assess steps and ambulation.     Follow Up Recommendations Home health PT    Equipment Recommendations  None recommended by PT    Recommendations for Other Services       Precautions / Restrictions Precautions Precautions: None Restrictions Weight Bearing Restrictions: No      Mobility  Bed Mobility Overal bed mobility: Modified Independent                Transfers Overall transfer level: Independent Equipment used: None             General transfer comment: (steady, no assist necessary)  Ambulation/Gait Ambulation/Gait assistance: (Patient and nurse report independent with ambulation.)              Stairs            Wheelchair Mobility    Modified Rankin (Stroke Patients Only)       Balance                                             Pertinent Vitals/Pain Pain Assessment: No/denies pain    Home Living Family/patient expects to be discharged to:: Private residence   Available Help at Discharge: Family Type of Home: House Home Access: Stairs to enter Entrance Stairs-Rails: Right Entrance Stairs-Number of Steps: (3)          Prior Function Level of Independence: Independent               Hand Dominance         Extremity/Trunk Assessment   Upper Extremity Assessment Upper Extremity Assessment: Overall WFL for tasks assessed    Lower Extremity Assessment Lower Extremity Assessment: Overall WFL for tasks assessed       Communication   Communication: No difficulties  Cognition Arousal/Alertness: Awake/alert Behavior During Therapy: WFL for tasks assessed/performed Overall Cognitive Status: Within Functional Limits for tasks assessed                                        General Comments      Exercises     Assessment/Plan    PT Assessment Patient needs continued PT services  PT Problem List Decreased mobility       PT Treatment Interventions Gait training;Stair training    PT Goals (Current goals can be found in the Care Plan section)  Acute Rehab PT Goals Patient Stated Goal: (to go home) PT Goal Formulation: With patient Time For Goal Achievement: 09/27/19 Potential to Achieve Goals: Good    Frequency Min 2X/week   Barriers to discharge        Co-evaluation  AM-PAC PT "6 Clicks" Mobility  Outcome Measure Help needed turning from your back to your side while in a flat bed without using bedrails?: None Help needed moving from lying on your back to sitting on the side of a flat bed without using bedrails?: None Help needed moving to and from a bed to a chair (including a wheelchair)?: None Help needed standing up from a chair using your arms (e.g., wheelchair or bedside chair)?: None Help needed to walk in hospital room?: A Little Help needed climbing 3-5 steps with a railing? : A Little 6 Click Score: 22    End of Session   Activity Tolerance: Patient tolerated treatment well Patient left: with bed alarm set Nurse Communication: (independent mobiltiy) PT Visit Diagnosis: Difficulty in walking, not elsewhere classified (R26.2)    Time: 0955-1010 PT Time Calculation (min) (ACUTE ONLY): 15 min   Charges:   PT  Evaluation $PT Eval Low Complexity: 1 Low            [place], Sherryl Barters, PT DPT 09/13/2019, 10:41 AM

## 2019-09-13 NOTE — Plan of Care (Signed)
Discharge instructions provided to pt.  All questions addressed.  Understanding verified through teach back.  Awaiting transportation home via taxi.   Problem: Education: Goal: Knowledge of General Education information will improve Description: Including pain rating scale, medication(s)/side effects and non-pharmacologic comfort measures 09/13/2019 1340 by , Fayette Pho, RN Outcome: Completed/Met 09/13/2019 1135 by Aubery Lapping, RN Outcome: Progressing   Problem: Health Behavior/Discharge Planning: Goal: Ability to manage health-related needs will improve 09/13/2019 1340 by Aubery Lapping, RN Outcome: Completed/Met 09/13/2019 1135 by Aubery Lapping, RN Outcome: Progressing   Problem: Clinical Measurements: Goal: Ability to maintain clinical measurements within normal limits will improve 09/13/2019 1340 by Aubery Lapping, RN Outcome: Completed/Met 09/13/2019 1135 by Aubery Lapping, RN Outcome: Progressing Goal: Will remain free from infection 09/13/2019 1340 by Aubery Lapping, RN Outcome: Completed/Met 09/13/2019 1135 by Aubery Lapping, RN Outcome: Progressing Goal: Diagnostic test results will improve 09/13/2019 1340 by Aubery Lapping, RN Outcome: Completed/Met 09/13/2019 1135 by Aubery Lapping, RN Outcome: Progressing Goal: Respiratory complications will improve 09/13/2019 1340 by Aubery Lapping, RN Outcome: Completed/Met 09/13/2019 1135 by Aubery Lapping, RN Outcome: Progressing Goal: Cardiovascular complication will be avoided 09/13/2019 1340 by Aubery Lapping, RN Outcome: Completed/Met 09/13/2019 1135 by Aubery Lapping, RN Outcome: Progressing   Problem: Activity: Goal: Risk for activity intolerance will decrease 09/13/2019 1340 by Aubery Lapping, RN Outcome: Completed/Met 09/13/2019 1135 by Aubery Lapping, RN Outcome: Progressing   Problem: Nutrition: Goal: Adequate nutrition  will be maintained 09/13/2019 1340 by Aubery Lapping, RN Outcome: Completed/Met 09/13/2019 1135 by Aubery Lapping, RN Outcome: Progressing   Problem: Coping: Goal: Level of anxiety will decrease 09/13/2019 1340 by Aubery Lapping, RN Outcome: Completed/Met 09/13/2019 1135 by Aubery Lapping, RN Outcome: Progressing   Problem: Elimination: Goal: Will not experience complications related to bowel motility 09/13/2019 1340 by Aubery Lapping, RN Outcome: Completed/Met 09/13/2019 1135 by Aubery Lapping, RN Outcome: Progressing Goal: Will not experience complications related to urinary retention 09/13/2019 1340 by Aubery Lapping, RN Outcome: Completed/Met 09/13/2019 1135 by Aubery Lapping, RN Outcome: Progressing   Problem: Pain Managment: Goal: General experience of comfort will improve 09/13/2019 1340 by Aubery Lapping, RN Outcome: Completed/Met 09/13/2019 1135 by Aubery Lapping, RN Outcome: Progressing   Problem: Safety: Goal: Ability to remain free from injury will improve 09/13/2019 1340 by Aubery Lapping, RN Outcome: Completed/Met 09/13/2019 1135 by Aubery Lapping, RN Outcome: Progressing   Problem: Skin Integrity: Goal: Risk for impaired skin integrity will decrease 09/13/2019 1340 by Aubery Lapping, RN Outcome: Completed/Met 09/13/2019 1135 by Aubery Lapping, RN Outcome: Progressing   Problem: Education: Goal: Understanding of CV disease, CV risk reduction, and recovery process will improve 09/13/2019 1340 by Aubery Lapping, RN Outcome: Completed/Met 09/13/2019 1135 by Aubery Lapping, RN Outcome: Progressing Goal: Individualized Educational Video(s) 09/13/2019 1340 by Aubery Lapping, RN Outcome: Completed/Met 09/13/2019 1135 by Aubery Lapping, RN Outcome: Progressing   Problem: Activity: Goal: Ability to return to baseline activity level will improve 09/13/2019 1340 by  , Fayette Pho, RN Outcome: Completed/Met 09/13/2019 1135 by Aubery Lapping, RN Outcome: Progressing   Problem: Cardiovascular: Goal: Ability to achieve and maintain adequate cardiovascular perfusion will improve 09/13/2019 1340 by Aubery Lapping, RN Outcome: Completed/Met 09/13/2019 1135 by Aubery Lapping, RN Outcome: Progressing Goal: Vascular access site(s) Level 0-1 will be maintained 09/13/2019 1340 by , Fayette Pho, RN Outcome: Completed/Met  09/13/2019 1135 by Aubery Lapping, RN Outcome: Progressing   Problem: Health Behavior/Discharge Planning: Goal: Ability to safely manage health-related needs after discharge will improve 09/13/2019 1340 by Shlok Raz, Fayette Pho, RN Outcome: Completed/Met 09/13/2019 1135 by Aubery Lapping, RN Outcome: Progressing   Problem: Acute Rehab PT Goals(only PT should resolve) Goal: Pt Will Ambulate Outcome: Completed/Met Goal: Pt Will Go Up/Down Stairs Outcome: Completed/Met

## 2019-09-13 NOTE — Plan of Care (Signed)
Pt ambulated around the nursing station with minimal dyspnea; only stand-by assistance provided.   Problem: Education: Goal: Knowledge of General Education information will improve Description: Including pain rating scale, medication(s)/side effects and non-pharmacologic comfort measures Outcome: Progressing   Problem: Health Behavior/Discharge Planning: Goal: Ability to manage health-related needs will improve Outcome: Progressing   Problem: Clinical Measurements: Goal: Ability to maintain clinical measurements within normal limits will improve Outcome: Progressing Goal: Will remain free from infection Outcome: Progressing Goal: Diagnostic test results will improve Outcome: Progressing Goal: Respiratory complications will improve Outcome: Progressing Goal: Cardiovascular complication will be avoided Outcome: Progressing   Problem: Activity: Goal: Risk for activity intolerance will decrease Outcome: Progressing   Problem: Nutrition: Goal: Adequate nutrition will be maintained Outcome: Progressing   Problem: Coping: Goal: Level of anxiety will decrease Outcome: Progressing   Problem: Elimination: Goal: Will not experience complications related to bowel motility Outcome: Progressing Goal: Will not experience complications related to urinary retention Outcome: Progressing   Problem: Pain Managment: Goal: General experience of comfort will improve Outcome: Progressing   Problem: Safety: Goal: Ability to remain free from injury will improve Outcome: Progressing   Problem: Skin Integrity: Goal: Risk for impaired skin integrity will decrease Outcome: Progressing   Problem: Education: Goal: Understanding of CV disease, CV risk reduction, and recovery process will improve Outcome: Progressing Goal: Individualized Educational Video(s) Outcome: Progressing   Problem: Activity: Goal: Ability to return to baseline activity level will improve Outcome: Progressing    Problem: Cardiovascular: Goal: Ability to achieve and maintain adequate cardiovascular perfusion will improve Outcome: Progressing Goal: Vascular access site(s) Level 0-1 will be maintained Outcome: Progressing   Problem: Health Behavior/Discharge Planning: Goal: Ability to safely manage health-related needs after discharge will improve Outcome: Progressing

## 2019-09-24 ENCOUNTER — Encounter: Payer: Medicare HMO | Attending: Cardiology

## 2019-10-03 DIAGNOSIS — Z818 Family history of other mental and behavioral disorders: Secondary | ICD-10-CM | POA: Diagnosis not present

## 2019-10-03 DIAGNOSIS — R531 Weakness: Secondary | ICD-10-CM | POA: Diagnosis not present

## 2019-10-03 DIAGNOSIS — H539 Unspecified visual disturbance: Secondary | ICD-10-CM | POA: Diagnosis not present

## 2019-10-03 DIAGNOSIS — Z87891 Personal history of nicotine dependence: Secondary | ICD-10-CM | POA: Diagnosis not present

## 2019-10-03 DIAGNOSIS — I69311 Memory deficit following cerebral infarction: Secondary | ICD-10-CM | POA: Diagnosis not present

## 2019-10-03 DIAGNOSIS — R7309 Other abnormal glucose: Secondary | ICD-10-CM | POA: Diagnosis not present

## 2019-10-03 DIAGNOSIS — R251 Tremor, unspecified: Secondary | ICD-10-CM | POA: Diagnosis not present

## 2019-10-03 DIAGNOSIS — Z79899 Other long term (current) drug therapy: Secondary | ICD-10-CM | POA: Diagnosis not present

## 2019-10-03 DIAGNOSIS — F0390 Unspecified dementia without behavioral disturbance: Secondary | ICD-10-CM | POA: Diagnosis not present

## 2019-10-06 ENCOUNTER — Other Ambulatory Visit: Payer: Self-pay | Admitting: Gerontology

## 2019-10-06 ENCOUNTER — Telehealth: Payer: Self-pay | Admitting: *Deleted

## 2019-10-06 DIAGNOSIS — I69311 Memory deficit following cerebral infarction: Secondary | ICD-10-CM

## 2019-10-06 DIAGNOSIS — H539 Unspecified visual disturbance: Secondary | ICD-10-CM

## 2019-10-08 ENCOUNTER — Telehealth: Payer: Self-pay | Admitting: *Deleted

## 2019-10-09 DIAGNOSIS — E7800 Pure hypercholesterolemia, unspecified: Secondary | ICD-10-CM | POA: Diagnosis not present

## 2019-10-09 DIAGNOSIS — I25118 Atherosclerotic heart disease of native coronary artery with other forms of angina pectoris: Secondary | ICD-10-CM | POA: Diagnosis not present

## 2019-10-09 DIAGNOSIS — F17200 Nicotine dependence, unspecified, uncomplicated: Secondary | ICD-10-CM | POA: Diagnosis not present

## 2019-10-09 DIAGNOSIS — I10 Essential (primary) hypertension: Secondary | ICD-10-CM | POA: Diagnosis not present

## 2019-10-09 DIAGNOSIS — R0602 Shortness of breath: Secondary | ICD-10-CM | POA: Diagnosis not present

## 2019-10-09 DIAGNOSIS — Z951 Presence of aortocoronary bypass graft: Secondary | ICD-10-CM | POA: Diagnosis not present

## 2019-10-09 DIAGNOSIS — I255 Ischemic cardiomyopathy: Secondary | ICD-10-CM | POA: Diagnosis not present

## 2019-10-27 ENCOUNTER — Ambulatory Visit: Payer: Medicare HMO

## 2019-10-27 DIAGNOSIS — R05 Cough: Secondary | ICD-10-CM | POA: Diagnosis not present

## 2019-10-27 DIAGNOSIS — J90 Pleural effusion, not elsewhere classified: Secondary | ICD-10-CM | POA: Diagnosis not present

## 2019-10-27 DIAGNOSIS — F17210 Nicotine dependence, cigarettes, uncomplicated: Secondary | ICD-10-CM | POA: Diagnosis not present

## 2019-10-27 DIAGNOSIS — J159 Unspecified bacterial pneumonia: Secondary | ICD-10-CM | POA: Diagnosis not present

## 2019-10-29 ENCOUNTER — Other Ambulatory Visit: Payer: Self-pay | Admitting: Family Medicine

## 2019-10-29 DIAGNOSIS — J90 Pleural effusion, not elsewhere classified: Secondary | ICD-10-CM

## 2019-10-29 DIAGNOSIS — J159 Unspecified bacterial pneumonia: Secondary | ICD-10-CM

## 2019-11-03 DIAGNOSIS — R0600 Dyspnea, unspecified: Secondary | ICD-10-CM | POA: Diagnosis not present

## 2019-11-03 DIAGNOSIS — F329 Major depressive disorder, single episode, unspecified: Secondary | ICD-10-CM | POA: Diagnosis not present

## 2019-11-03 DIAGNOSIS — J90 Pleural effusion, not elsewhere classified: Secondary | ICD-10-CM | POA: Diagnosis not present

## 2019-11-03 DIAGNOSIS — J189 Pneumonia, unspecified organism: Secondary | ICD-10-CM | POA: Diagnosis not present

## 2019-11-03 DIAGNOSIS — F419 Anxiety disorder, unspecified: Secondary | ICD-10-CM | POA: Diagnosis not present

## 2019-11-11 ENCOUNTER — Encounter (INDEPENDENT_AMBULATORY_CARE_PROVIDER_SITE_OTHER): Payer: Medicare HMO

## 2019-11-11 ENCOUNTER — Ambulatory Visit (INDEPENDENT_AMBULATORY_CARE_PROVIDER_SITE_OTHER): Payer: Medicare HMO | Admitting: Nurse Practitioner

## 2019-11-11 ENCOUNTER — Ambulatory Visit: Payer: Medicare HMO | Admitting: Physical Therapy

## 2019-11-11 DIAGNOSIS — R000 Tachycardia, unspecified: Secondary | ICD-10-CM | POA: Diagnosis not present

## 2019-11-11 DIAGNOSIS — R404 Transient alteration of awareness: Secondary | ICD-10-CM | POA: Diagnosis not present

## 2019-11-11 DIAGNOSIS — R0689 Other abnormalities of breathing: Secondary | ICD-10-CM | POA: Diagnosis not present

## 2019-11-11 DIAGNOSIS — R4020 Unspecified coma: Secondary | ICD-10-CM | POA: Diagnosis not present

## 2019-11-11 DIAGNOSIS — I499 Cardiac arrhythmia, unspecified: Secondary | ICD-10-CM | POA: Diagnosis not present

## 2019-11-13 ENCOUNTER — Other Ambulatory Visit (INDEPENDENT_AMBULATORY_CARE_PROVIDER_SITE_OTHER): Payer: Self-pay | Admitting: Vascular Surgery

## 2019-11-13 DIAGNOSIS — I70213 Atherosclerosis of native arteries of extremities with intermittent claudication, bilateral legs: Secondary | ICD-10-CM

## 2019-11-17 ENCOUNTER — Ambulatory Visit: Payer: Medicare HMO | Admitting: Physical Therapy

## 2019-11-19 ENCOUNTER — Ambulatory Visit (INDEPENDENT_AMBULATORY_CARE_PROVIDER_SITE_OTHER): Payer: Medicare HMO | Admitting: Nurse Practitioner

## 2019-11-19 ENCOUNTER — Encounter: Payer: Medicare HMO | Admitting: Physical Therapy

## 2019-11-19 ENCOUNTER — Encounter (INDEPENDENT_AMBULATORY_CARE_PROVIDER_SITE_OTHER): Payer: Medicare HMO

## 2019-11-24 ENCOUNTER — Ambulatory Visit: Payer: Medicare HMO | Admitting: Physical Therapy

## 2019-11-26 ENCOUNTER — Encounter: Payer: Medicare HMO | Admitting: Physical Therapy

## 2019-12-01 ENCOUNTER — Encounter: Payer: Medicare HMO | Admitting: Physical Therapy

## 2019-12-03 ENCOUNTER — Encounter: Payer: Medicare HMO | Admitting: Physical Therapy

## 2019-12-08 ENCOUNTER — Encounter: Payer: Medicare HMO | Admitting: Physical Therapy

## 2019-12-10 ENCOUNTER — Encounter: Payer: Medicare HMO | Admitting: Physical Therapy

## 2019-12-15 ENCOUNTER — Encounter: Payer: Medicare HMO | Admitting: Physical Therapy

## 2019-12-17 ENCOUNTER — Encounter: Payer: Medicare HMO | Admitting: Physical Therapy

## 2019-12-22 ENCOUNTER — Encounter: Payer: Medicare HMO | Admitting: Physical Therapy

## 2019-12-24 ENCOUNTER — Encounter: Payer: Medicare HMO | Admitting: Physical Therapy

## 2020-02-17 ENCOUNTER — Other Ambulatory Visit (INDEPENDENT_AMBULATORY_CARE_PROVIDER_SITE_OTHER): Payer: Medicare HMO

## 2020-02-17 ENCOUNTER — Encounter (INDEPENDENT_AMBULATORY_CARE_PROVIDER_SITE_OTHER): Payer: Medicare HMO

## 2020-02-17 ENCOUNTER — Ambulatory Visit (INDEPENDENT_AMBULATORY_CARE_PROVIDER_SITE_OTHER): Payer: Medicare HMO | Admitting: Vascular Surgery

## 2020-03-31 IMAGING — DX DG CHEST 1V PORT
1 series · 1 of 1 positions shown · non-contrast
Comparison: Radiograph 08/13/2017, CT 09/22/2013

CLINICAL DATA: Chest pain

EXAM:
PORTABLE CHEST 1 VIEW

[chest ap]
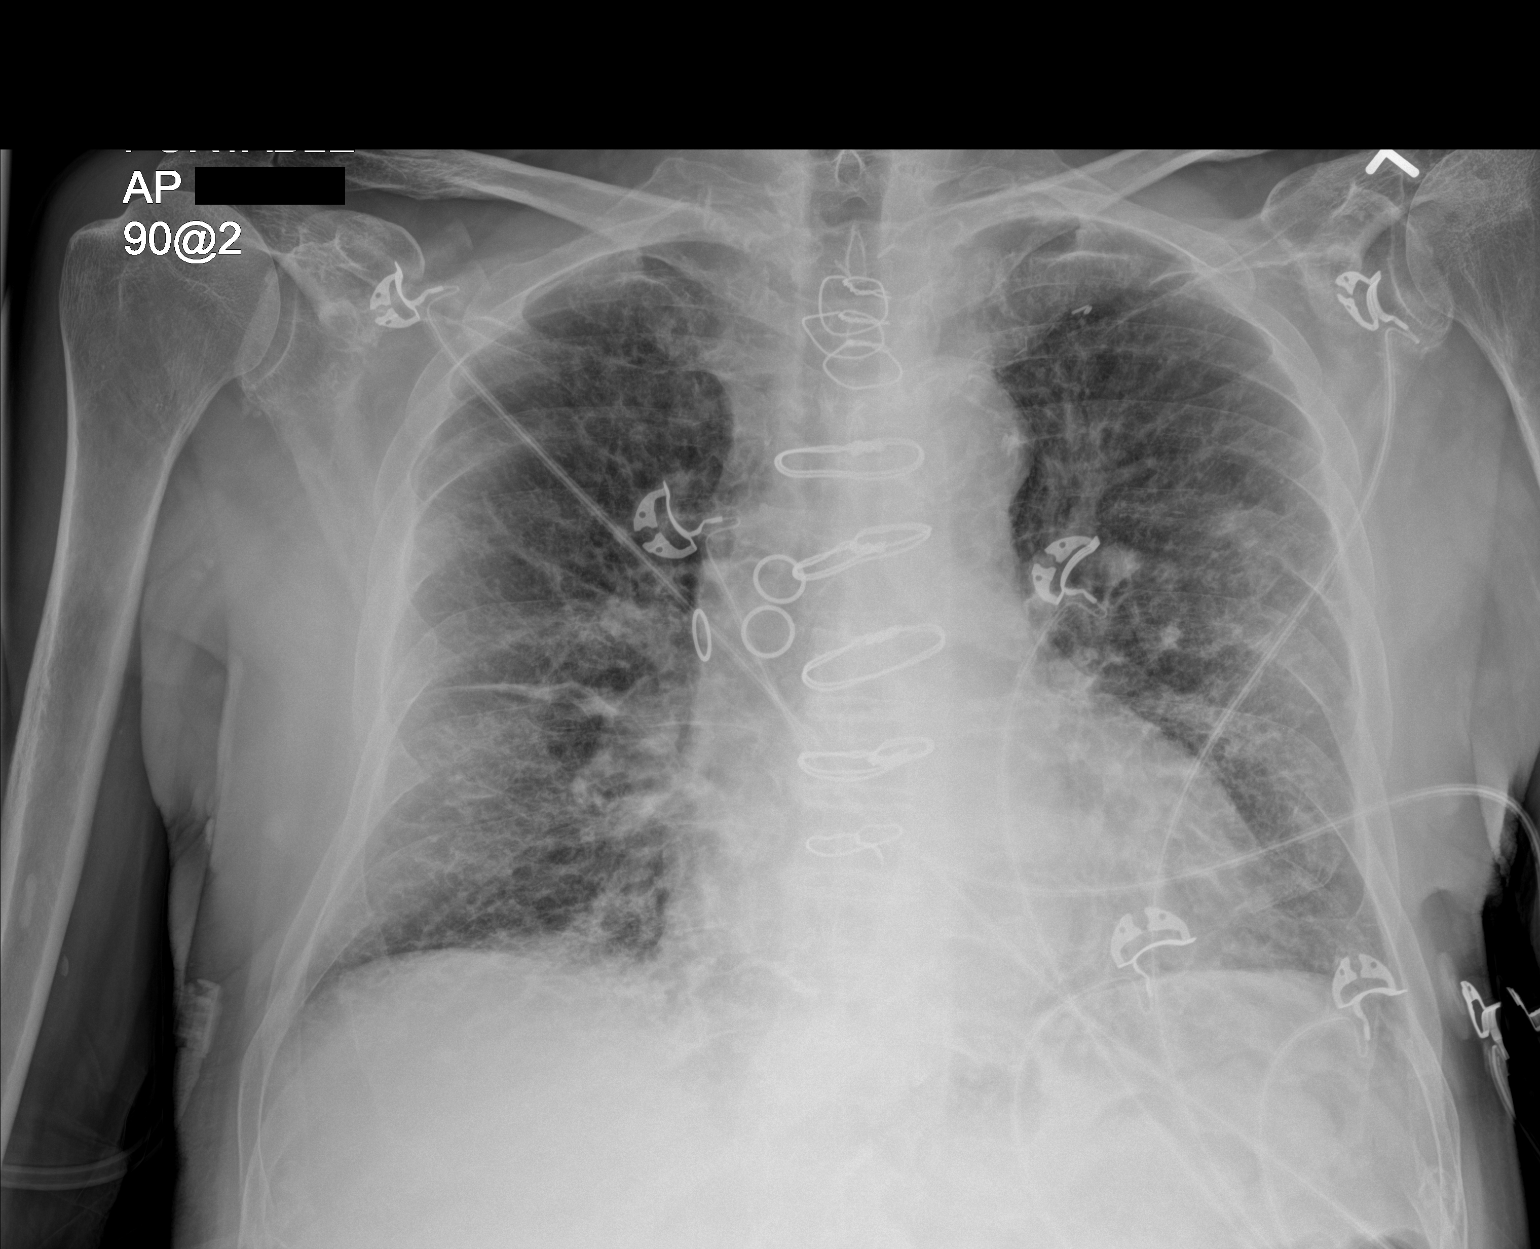

[1 of 1 positions shown; findings below may reference images not displayed]

FINDINGS: Continued progression of the diffuse interstitial changes throughout
the lungs. More patchy areas of opacity are seen in the right mid
lung and periphery of the left lung. No pneumothorax or visible
effusion. Postsurgical changes related to prior CABG including
intact and aligned sternotomy wires and multiple surgical clips
projecting over the mediastinum. Degenerative changes are present in
the imaged spine and shoulders.
IMPRESSION: Continued progression of the diffuse interstitial changes throughout
the lungs.

More patchy opacities in the mid lungs may reflect a superimposed
consolidative process.
